# Patient Record
Sex: Female | Born: 1980 | Race: Black or African American | Hispanic: No | Marital: Single | Smoking: Never smoker
Health system: Southern US, Community
[De-identification: ages and names within clinical notes are randomized; demographics above are authoritative.]

## PROBLEM LIST (undated history)

## (undated) DIAGNOSIS — N920 Excessive and frequent menstruation with regular cycle: Secondary | ICD-10-CM

## (undated) HISTORY — DX: Excessive and frequent menstruation with regular cycle: N92.0

## (undated) HISTORY — PX: TONSILLECTOMY: SUR1361

---
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
## 2002-04-11 ENCOUNTER — Other Ambulatory Visit: Admission: RE | Admit: 2002-04-11 | Discharge: 2002-04-11 | Payer: Self-pay | Admitting: Obstetrics and Gynecology

## 2002-09-25 ENCOUNTER — Emergency Department (HOSPITAL_COMMUNITY): Admission: EM | Admit: 2002-09-25 | Discharge: 2002-09-25 | Payer: Self-pay | Admitting: Emergency Medicine

## 2003-04-25 ENCOUNTER — Other Ambulatory Visit: Admission: RE | Admit: 2003-04-25 | Discharge: 2003-04-25 | Payer: Self-pay | Admitting: Obstetrics and Gynecology

## 2004-05-13 ENCOUNTER — Other Ambulatory Visit: Admission: RE | Admit: 2004-05-13 | Discharge: 2004-05-13 | Payer: Self-pay | Admitting: Obstetrics and Gynecology

## 2006-03-02 ENCOUNTER — Inpatient Hospital Stay (HOSPITAL_COMMUNITY): Admission: AD | Admit: 2006-03-02 | Discharge: 2006-03-03 | Payer: Self-pay | Admitting: Obstetrics and Gynecology

## 2006-03-10 ENCOUNTER — Emergency Department (HOSPITAL_COMMUNITY): Admission: EM | Admit: 2006-03-10 | Discharge: 2006-03-10 | Payer: Self-pay | Admitting: Family Medicine

## 2006-03-11 ENCOUNTER — Emergency Department (HOSPITAL_COMMUNITY): Admission: EM | Admit: 2006-03-11 | Discharge: 2006-03-11 | Payer: Self-pay | Admitting: Emergency Medicine

## 2006-04-19 ENCOUNTER — Emergency Department (HOSPITAL_COMMUNITY): Admission: EM | Admit: 2006-04-19 | Discharge: 2006-04-19 | Payer: Self-pay | Admitting: Family Medicine

## 2006-08-11 ENCOUNTER — Emergency Department (HOSPITAL_COMMUNITY): Admission: EM | Admit: 2006-08-11 | Discharge: 2006-08-11 | Payer: Self-pay | Admitting: Emergency Medicine

## 2006-09-29 ENCOUNTER — Other Ambulatory Visit: Admission: RE | Admit: 2006-09-29 | Discharge: 2006-09-29 | Payer: Self-pay | Admitting: Obstetrics and Gynecology

## 2010-01-10 ENCOUNTER — Encounter: Payer: Self-pay | Admitting: Family Medicine

## 2010-01-10 ENCOUNTER — Other Ambulatory Visit: Admission: RE | Admit: 2010-01-10 | Discharge: 2010-01-10 | Payer: Self-pay | Admitting: Family Medicine

## 2010-01-10 LAB — CONVERTED CEMR LAB: Pap Smear: NORMAL

## 2010-02-13 ENCOUNTER — Emergency Department (HOSPITAL_COMMUNITY): Admission: EM | Admit: 2010-02-13 | Discharge: 2010-02-13 | Payer: Self-pay | Admitting: Family Medicine

## 2010-09-29 ENCOUNTER — Ambulatory Visit: Payer: Self-pay | Admitting: Family Medicine

## 2010-09-29 DIAGNOSIS — N920 Excessive and frequent menstruation with regular cycle: Secondary | ICD-10-CM | POA: Insufficient documentation

## 2010-09-29 DIAGNOSIS — N898 Other specified noninflammatory disorders of vagina: Secondary | ICD-10-CM | POA: Insufficient documentation

## 2010-09-29 LAB — CONVERTED CEMR LAB
Cholesterol: 154 mg/dL (ref 0–200)
HDL: 46.7 mg/dL (ref >39.00)
LDL Cholesterol: 90 mg/dL (ref 0–99)
Triglycerides: 86 mg/dL (ref 0.0–149.0)
VLDL: 17.2 mg/dL (ref 0.0–40.0)
Whiff Test: POSITIVE

## 2010-10-22 ENCOUNTER — Ambulatory Visit: Payer: Self-pay | Admitting: Family Medicine

## 2010-10-22 DIAGNOSIS — R300 Dysuria: Secondary | ICD-10-CM | POA: Insufficient documentation

## 2010-10-22 LAB — CONVERTED CEMR LAB
Bilirubin Urine: NEGATIVE
Glucose, Urine, Semiquant: NEGATIVE
Ketones, urine, test strip: NEGATIVE
Protein, U semiquant: NEGATIVE
Specific Gravity, Urine: >=1.03
Urine crystals, microscopic: 0 /hpf
Urobilinogen, UA: 0.2
Whiff Test: NEGATIVE

## 2010-11-10 ENCOUNTER — Ambulatory Visit: Payer: Self-pay | Admitting: Family Medicine

## 2010-11-10 LAB — CONVERTED CEMR LAB
KOH Prep: NEGATIVE
Whiff Test: POSITIVE

## 2010-11-11 LAB — CONVERTED CEMR LAB: Chlamydia, DNA Probe: NEGATIVE

## 2010-11-21 ENCOUNTER — Encounter: Payer: Self-pay | Admitting: Family Medicine

## 2011-01-06 NOTE — Letter (Signed)
Summary: Generic Letter  [facility]  [address] [place], [place] [address]   Phone: [phone]  Fax: [phone]    09/29/2010  Aryel [place] [address] [place], [place]  [address]  Dear Ms. [place],     We have received your lab results and Dr. Dayton Martes says that your cholesterol levels look excellent!!! Keep up the good work.  Enclosed is a copy of your lab results.      Sincerely,       Linde Gillis CMA (AAMA)for Dr. Ruthe Mannan

## 2011-01-06 NOTE — Assessment & Plan Note (Signed)
Summary: ? VAGINAL INFECTION   Vital Signs:  Patient profile:   30 year old female Height:      64.5 inches Weight:      246.04 pounds BMI:     41.73 Temp:     98.5 degrees F oral Pulse rate:   80 / minute Pulse rhythm:   regular BP sitting:   120 / 72  (left arm) Cuff size:   large  Vitals Entered By: Benny Lennert CMA Duncan Dull) (October 22, 2010 3:53 PM)  History of Present Illness: Chief complaint ? vaginal infection  pt seen by Dr Dayton Martes in oct for BV and was tx with two times a day flagyl for one week  had odor and discharge at that time  vomiting almost every day from the flagyl  no pelvic pain   period is about to start   now wonders if she also had a uti - frequent urination and some burning  that started 3 days ago  never had one in the past   some low back pain no fever  no blood in urine   stopped the yaz after week of vomiting  will start back with next menses  not sexually active - so no worries about std  no hx of std      Allergies (verified): No Known Drug Allergies  Past History:  Past Medical History: Last updated: 09/29/2010 Menorrhagia  Past Surgical History: Last updated: 09/29/2010 Tonsillectomy  Family History: Last updated: 09/29/2010 Family History Diabetes 1st degree relative  Social History: Last updated: 09/29/2010 Never Smoked Alcohol use-yes single, 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 Eyes:  Denies discharge and eye irritation. CV:  Denies chest pain or discomfort and lightheadness. Resp:  Denies shortness of breath. GI:  Denies abdominal pain, change in bowel habits, nausea, and vomiting. GU:  Complains of dysuria; denies discharge. Derm:  Complains of itching; denies rash. Heme:  Denies abnormal bruising and bleeding.  Physical Exam  General:  alert and overweight-appearing.     Head:  normocephalic, atraumatic, and no abnormalities observed.   Eyes:  vision grossly intact, pupils equal, pupils round, and pupils reactive to light.   Mouth:  throat is clear  Neck:  No deformities, masses, or tenderness noted. Lungs:  Normal respiratory effort, chest expands symmetrically. Lungs are clear to auscultation, no crackles or wheezes. Heart:  Normal rate and regular rhythm. S1 and S2 normal without gallop, murmur, click, rub or other extra sounds. Abdomen:  no suprapubic tenderness or fullness felt  Genitalia:  white somewhat thick dc noted without odor  normal introitus and no external lesions.   Skin:  Intact without suspicious lesions or rashes Cervical Nodes:  No lymphadenopathy noted Inguinal Nodes:  No significant adenopathy Psych:  normal affect, talkative and pleasant    Impression & Recommendations:  Problem # 1:  VAGINAL DISCHARGE (ICD-623.5) Assessment Deteriorated  with some clue cells - pt priorly could not keep down flagyl so tx with metrogel vaginal  also few hyphae- tx with diflucan update if not imp Her updated medication list for this problem includes:    Metrogel-vaginal 0.75 % Gel (Metronidazole) .Marland Kitchen... 1 applicator intravaginally at bedtime for 10 days  Orders: UA Dipstick W/ Micro (manual) ([address]) Wet Prep (60454UJ)  Problem # 2:  DYSURIA (ICD-788.1) Assessment: New ? assoc with above few wbc in urine sent for cx  update  disc imp of water intake The following medications were removed from the medication list:    Flagyl 500 Mg Tabs (Metronidazole) .Marland Kitchen... 1 tab by mouth two times a day x 7 days  Orders: T-Culture, Urine ([address]) Specimen Handling ([address]) UA Dipstick W/ Micro (manual) ([address]) Wet Prep (91478GN)  Complete Medication List: 1)  Yaz 3-0.02 Mg Tabs (Drospirenone-ethinyl estradiol) .... Use  as directed 2)  Metrogel-vaginal 0.75 % Gel (Metronidazole) .Marland Kitchen.. 1 applicator intravaginally at bedtime for 10 days 3)   Diflucan 150 Mg Tabs (Fluconazole) .... Take one by mouth times one for yeast infection  Patient Instructions: 1)  take the diflucan pill orally for yeast  2)  use the metrogel vaginal for bacterial vaginosis  3)  I am sending urine for a culture -- and will update you when that returns  4)  drink lots of water  Prescriptions: METROGEL-VAGINAL 0.75 % GEL (METRONIDAZOLE) 1 applicator intravaginally at bedtime for 10 days  #1 course x 0   Entered and Authorized by:   Judith Part MD   Signed by:   Judith Part MD on 10/22/2010   Method used:   Print then Give to Patient   RxID:   [id number] DIFLUCAN 150 MG TABS (FLUCONAZOLE) take one by mouth times one for yeast infection  #1 x 0   Entered and Authorized by:   Judith Part MD   Signed by:   Judith Part MD on 10/22/2010   Method used:   Print then Give to Patient   RxID:   [id number] METROGEL-VAGINAL 0.75 % GEL (METRONIDAZOLE) 1 applicator intravaginally at bedtime for 10 days  #1 course x 0   Entered and Authorized by:   Judith Part MD   Signed by:   Judith Part MD on 10/22/2010   Method used:   Telephoned to ...       [facility] (retail)       [address]       [place], [place]    [place]       Ph: [phone]       Fax: [phone]   RxID:   [id number]    Orders Added: 1)  T-Culture, Urine [[id number]] 2)  Specimen Handling [[address]] 3)  Est. Patient Level III [35573] 4)  UA Dipstick W/ Micro (manual) [81[address]] 5)  Wet Prep [22025KY]    Current Allergies (reviewed today): No known allergies   Laboratory Results   Urine Tests  Date/Time Received: October 22, 2010 4:06 PM  Date/Time Reported: October 22, 2010 4:06 PM   Routine Urinalysis   Color: yellow Appearance: Clear Glucose: negative   (Normal Range: Negative) Bilirubin: negative   (Normal Range: Negative) Ketone: negative   (Normal Range: Negative) Spec. Gravity: >=1.030   (Normal Range:  1.003-1.035) Blood: trace-lysed   (Normal Range: Negative) pH: 5.0   (Normal Range: 5.0-8.0) Protein: negative   (Normal Range: Negative) Urobilinogen: 0.2   (Normal Range: 0-1) Nitrite: negative   (Normal Range: Negative) Leukocyte Esterace: trace   (Normal Range: Negative)  Urine Microscopic WBC/HPF: 1-2 RBC/HPF: 0 Bacteria/HPF: few Mucous/HPF: few Epithelial/HPF: 0-1 Crystals/HPF: 0 Casts/LPF: 0 Yeast/HPF: 0 Other: 0      Wet Mount Source: vaginal WBC/hpf: 10-20 Bacteria/hpf: 1+  Rods Clue cells/hpf: few  Negative whiff Yeast/hpf: few Wet Mount KOH: Negative Trichomonas/hpf: none

## 2011-01-06 NOTE — Assessment & Plan Note (Signed)
Summary: VAGINAL INFECTION/CLE   Vital Signs:  Patient profile:   30 year old female Height:      64.5 inches Weight:      243.50 pounds BMI:     41.30 Temp:     98.7 degrees F oral Pulse rate:   72 / minute Pulse rhythm:   regular BP sitting:   120 / 80  (left arm) Cuff size:   large  Vitals Entered By: Linde Gillis CMA Duncan Dull) (November 10, 2010 2:56 PM) CC: vaginal infection   History of Present Illness: Chief complaint ? vaginal infection Treated in October for BV with Flagyl. vomiting almost every day from the Flagyl so admittedly not very compliant. no pelvic pain   period is about to start    no hx of std and not currently sexually active. Saw Dr. Milinda Antis last month, treated with Metrogel, sees no improvement.  Still has odor and some discharge. No itching or pelvic pain.     Current Medications (verified): 1)  Yaz 3-0.02 Mg  Tabs (Drospirenone-Ethinyl Estradiol) .... Use  As Directed 2)  Clindamycin Hcl 300 Mg Caps (Clindamycin Hcl) .Marland Kitchen.. 1 Tab By Mouth Two Times A Day X 7 Days  Allergies (verified): No Known Drug Allergies  Review of Systems      See HPI GU:  Complains of discharge; denies abnormal vaginal bleeding, dysuria, incontinence, urinary frequency, and urinary hesitancy.  Physical Exam  General:  alert and overweight-appearing.   Genitalia:  white somewhat thick dc noted without odor  normal introitus and no external lesions.   Psych:  normal affect, talkative and pleasant    Impression & Recommendations:  Problem # 1:  VAGINAL DISCHARGE (ICD-623.5) Assessment Deteriorated Will try oral clindamycin.  Follow up if no improvement, consider Boric acid. Although pt not sexually active currently, will get GC/Chlamydia probe. The following medications were removed from the medication list:    Metrogel-vaginal 0.75 % Gel (Metronidazole) .Marland Kitchen... 1 applicator intravaginally at bedtime for 10 days  Orders: T-GC Probe, genital  [phone]) T-Chlamydia Probe, genital ([address]-91478) Wet Prep [phone])  Complete Medication List: 1)  Yaz 3-0.02 Mg Tabs (Drospirenone-ethinyl estradiol) .... Use  as directed 2)  Clindamycin Hcl 300 Mg Caps (Clindamycin hcl) .Marland Kitchen.. 1 tab by mouth two times a day x 7 days Prescriptions: CLINDAMYCIN HCL 300 MG CAPS (CLINDAMYCIN HCL) 1 tab by mouth two times a day x 7 days  #14 x 0   Entered and Authorized by:   Ruthe Mannan MD   Signed by:   Ruthe Mannan MD on 11/10/2010   Method used:   Electronically to        [id number] (retail)       [address]       [place], [place]  [address]       Ph: [phone]       Fax: [phone]   RxID:   [id number]    Orders Added: 1)  T-GC Probe, genital [phone] 2)  T-Chlamydia Probe, genital [[id number]] 3)  Wet Prep [16010XN] 4)  Est. Patient Level IV [23557]    Current Allergies (reviewed today): No known allergies   Laboratory Results    Wet Mount/KOH Source: vaginal WBC/hpf 1-5 Bacteria/hpf 2+ Clue cells/hpf few  Positive whiff Yeast/hpf none KOH Negative Trichomonas/hpf none

## 2011-01-06 NOTE — Assessment & Plan Note (Signed)
Summary: NEW PT TO EST/CLE   Vital Signs:  Patient profile:   30 year old female Height:      64.5 inches Weight:      243 pounds BMI:     41.22 Temp:     98.3 degrees F oral Pulse rate:   80 / minute Pulse rhythm:   regular BP sitting:   118 / 74  (left arm) Cuff size:   large  Vitals Entered By: Linde Gillis CMA Duncan Dull) (September 29, 2010 9:56 AM) CC: new patient, establish care   History of Present Illness: 30 yo here to establish care.  Vaginal discharge- thin vaginal d/c for past two months, assocaited with an odor.  No itching or burning.  Has not had sexual intercourse in over 1 1/2 years.  No dysuria or labial or vaginal lesions. No fevers, chills or abdominal pain.  Menorrhagia- periods very irregular.  LMP was 04/17/2010.  Has been irregular since she started menstruating.  When she does have a peiod, it is very heavy and associated with cramping.  Was previously on Yaz which regulated her periods well.  Preventive Screening-Counseling & Management  Alcohol-Tobacco     Smoking Status: never  Current Medications (verified): 1)  Cheratussin Ac 100-10 Mg/65ml Syrp (Guaifenesin-Codeine) .... Take As Directed 2)  Flagyl 500 Mg Tabs (Metronidazole) .Marland Kitchen.. 1 Tab By Mouth Two Times A Day X 7 Days 3)  Yaz 3-0.02 Mg  Tabs (Drospirenone-Ethinyl Estradiol) .... 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 Eyes:  Denies blurring. ENT:  Denies difficulty swallowing. CV:  Denies chest pain or discomfort. Resp:  Denies shortness of breath. GI:  Denies abdominal pain, nausea, and vomiting. GU:  Complains of discharge; denies dysuria, genital sores, hematuria, incontinence, nocturia, urinary frequency, and urinary hesitancy. MS:  Denies joint pain, joint redness, and joint swelling. Derm:  Denies rash. Neuro:  Denies headaches. Psych:  Denies anxiety and depression. Endo:  Denies cold intolerance and heat intolerance. Heme:  Denies abnormal bruising and bleeding.  Physical Exam  General:  alert and overweight-appearing.   Head:  normocephalic, atraumatic, and no abnormalities observed.   Eyes:  vision grossly intact, pupils equal, pupils round, and pupils reactive to light.   Ears:  R ear normal and L ear normal.   Nose:  no external deformity.   Mouth:  good dentition.   Lungs:  Normal respiratory effort, chest expands symmetrically. Lungs are clear to auscultation, no crackles or wheezes. Heart:  Normal rate and regular rhythm. S1 and S2 normal without gallop, murmur, click, rub or other extra sounds. Abdomen:  Bowel sounds positive,abdomen soft and non-tender without masses, organomegaly or hernias noted. Genitalia:  thin vaginal d/c normal introitus, no external lesions, no vaginal or cervical lesions, no vaginal atrophy, and no friaility or hemorrhage.   Msk:  normal ROM.   Extremities:  no edema Neurologic:  alert & oriented X3 and DTRs symmetrical and normal.   Skin:  Intact without suspicious lesions or rashes Psych:  Cognition and judgment appear intact. Alert and cooperative with normal attention span and concentration. No apparent delusions, illusions, hallucinations   Impression & Recommendations:  Problem # 1:  VAGINAL DISCHARGE (ICD-623.5) Assessment New Wet prep consistent with BV. Will treat with Flagyl x 7 days. Orders: Wet Prep ([address]UE)  Problem #  2:  MENORRHAGIA (ICD-626.2) Assessment: Deteriorated Will restart Yaz to regulate periods. ?PCOS- check lipids and glucose today. Her updated medication list for this problem includes:    Yaz 3-0.02 Mg Tabs (Drospirenone-ethinyl estradiol) ..... Use  as directed  Complete Medication List: 1)  Cheratussin Ac 100-10 Mg/81ml Syrp (Guaifenesin-codeine) .... Take as directed 2)  Flagyl 500 Mg Tabs (Metronidazole) .Marland Kitchen.. 1 tab by mouth two times a day x 7 days 3)  Yaz 3-0.02 Mg Tabs (Drospirenone-ethinyl estradiol) .... Use  as directed  Other Orders: Venipuncture (45409) TLB-Lipid Panel (80061-LIPID) Prescriptions: YAZ 3-0.02 MG  TABS (DROSPIRENONE-ETHINYL ESTRADIOL) Use  as directed  #1 x 11   Entered and Authorized by:   Ruthe Mannan MD   Signed by:   Ruthe Mannan MD on 09/29/2010   Method used:   Faxed to ...       [facility] (retail)       [address]       [place], [place]    [place]       Ph: [phone]       Fax: [phone]   RxID:   [id number] FLAGYL 500 MG TABS (METRONIDAZOLE) 1 tab by mouth two times a day x 7 days  #14 x 0   Entered and Authorized by:   Ruthe Mannan MD   Signed by:   Ruthe Mannan MD on 09/29/2010   Method used:   Faxed to ...       [facility] (retail)       [address]       [place], [place]    [place]       Ph: [phone]       Fax: [phone]   RxID:   [id number]    Orders Added: 1)  Wet Prep [41660YT] 2)  Venipuncture [01601] 3)  TLB-Lipid Panel [80061-LIPID] 4)  New Patient Level III [09323]    Current Allergies (reviewed today): No known allergies  Laboratory Results    Wet Mount/KOH Source: vaginal Bacteria/hpf rare Clue cells/hpf few  Positive whiff Yeast/hpf none Trichomonas/hpf none   Flu Vaccine Result Date:  09/22/2010 Flu Vaccine Result:  historical TD Result Date:  08/02/2002 TD Result:  historical PAP Result Date:  11/21/2009 PAP Result:  historical-normal    Past Medical  History:    Menorrhagia  Past Surgical History:    Tonsillectomy

## 2011-01-08 NOTE — Miscellaneous (Signed)
Summary: prevention update  Clinical Lists Changes  Observations: Added new observation of PAP DUE: 01/11/2012 (11/21/2010 13:41) Added new observation of HDLNXTDUE: 09/30/2015 (11/21/2010 13:41) Added new observation of LDLNXTDUE: 09/30/2015 (11/21/2010 13:41) Added new observation of LAST PAP DAT: 01/10/2010 (01/10/2010 13:42) Added new observation of PAP SMEAR: normal (01/10/2010 13:42)     Last PAP:  historical-normal (11/21/2009 10:33:02 AM) PAP Result Date:  01/10/2010 PAP Result:  normal PAP Next Due:  2 yr

## 2011-01-08 NOTE — Letter (Signed)
Summary: [facility] [place]   Imported By: Lanelle Bal 11/28/2010 11:48:13  _____________________________________________________________________  External Attachment:    Type:   Image     Comment:   External Document

## 2011-02-26 ENCOUNTER — Other Ambulatory Visit (INDEPENDENT_AMBULATORY_CARE_PROVIDER_SITE_OTHER): Payer: 59 | Admitting: Family Medicine

## 2011-02-26 ENCOUNTER — Encounter: Payer: Self-pay | Admitting: Family Medicine

## 2011-02-26 DIAGNOSIS — Z0000 Encounter for general adult medical examination without abnormal findings: Secondary | ICD-10-CM

## 2011-02-26 DIAGNOSIS — Z136 Encounter for screening for cardiovascular disorders: Secondary | ICD-10-CM

## 2011-02-26 LAB — BASIC METABOLIC PANEL
CO2: 25 mEq/L (ref 19–32)
Calcium: 9 mg/dL (ref 8.4–10.5)
Chloride: 102 mEq/L (ref 96–112)
Glucose, Bld: 88 mg/dL (ref 70–99)
Potassium: 4.3 mEq/L (ref 3.5–5.1)

## 2011-02-26 LAB — LIPID PANEL
Cholesterol: 163 mg/dL (ref 0–200)
Total CHOL/HDL Ratio: 3
Triglycerides: 102 mg/dL (ref 0.0–149.0)
VLDL: 20.4 mg/dL (ref 0.0–40.0)

## 2011-02-26 LAB — HM PAP SMEAR

## 2011-03-02 ENCOUNTER — Ambulatory Visit (INDEPENDENT_AMBULATORY_CARE_PROVIDER_SITE_OTHER): Payer: 59 | Admitting: Family Medicine

## 2011-03-02 ENCOUNTER — Encounter: Payer: Self-pay | Admitting: Family Medicine

## 2011-03-02 DIAGNOSIS — N920 Excessive and frequent menstruation with regular cycle: Secondary | ICD-10-CM

## 2011-03-02 DIAGNOSIS — N898 Other specified noninflammatory disorders of vagina: Secondary | ICD-10-CM

## 2011-03-02 DIAGNOSIS — Z0000 Encounter for general adult medical examination without abnormal findings: Secondary | ICD-10-CM

## 2011-03-02 DIAGNOSIS — G4700 Insomnia, unspecified: Secondary | ICD-10-CM | POA: Insufficient documentation

## 2011-03-02 MED ORDER — NORGESTIM-ETH ESTRAD TRIPHASIC 0.18/0.215/0.25 MG-35 MCG PO TABS: 1.0000 | ORAL_TABLET | ORAL | Status: DC

## 2011-03-02 MED ORDER — TRAZODONE HCL 50 MG PO TABS: 50.0000 mg | ORAL_TABLET | Freq: Every day | ORAL | Status: DC

## 2011-03-02 NOTE — Assessment & Plan Note (Signed)
Improved but noticing more irritability and insomnia. Will d/c Yaz, try trisprintec.

## 2011-03-02 NOTE — Patient Instructions (Signed)
IMPORTANT: HOW TO USE THIS INFORMATION:  This is a summary and does NOT have all possible information about this product. This information does not assure that this product is safe, effective, or appropriate for you. This information is not individual medical advice and does not substitute for the advice of your health care professional. Always ask your health care professional for complete information about this product and your specific health needs.    TRAZODONE - ORAL (TRAZZ-oh-doan)    COMMON BRAND NAME(S): Desyrel    WARNING:  Antidepressant medications are used to treat a variety of conditions, including depression and other mental/mood disorders. These medications can help prevent suicidal thoughts/attempts and provide other important benefits. However, studies have shown that a small number of people (especially people younger than 69) who take antidepressants for any condition may experience worsening depression, other mental/mood symptoms, or suicidal thoughts/attempts. Therefore, it is very important to talk with the doctor about the risks and benefits of antidepressant medication (especially for people younger than 25), even if treatment is not for a mental/mood condition. Tell the doctor immediately if you notice worsening depression/other psychiatric conditions, unusual behavior changes (including possible suicidal thoughts/attempts), or other mental/mood changes (including new/worsening anxiety, panic attacks, trouble sleeping, irritability, hostile/angry feelings, impulsive actions, severe restlessness, very rapid speech). Be especially watchful for these symptoms when a new antidepressant is started or when the dose is changed.    USES:  This medication is used to treat depression. Trazodone works by helping to restore the balance of a certain natural chemical (serotonin) in the brain.    OTHER USES:  This section contains uses of this drug that are not listed in the approved professional  labeling for the drug but that may be prescribed by your health care professional. Use this drug for a condition that is listed in this section only if it has been so prescribed by your health care professional. This drug is used to help people with trouble sleeping (insomnia) to fall asleep. It is also used to help people with anxiety to relax.    HOW TO USE:  Read the Medication Guide provided by your pharmacist before you start using trazodone and each time you get a refill. If you have any questions, consult your doctor or pharmacist. Take this medication by mouth, usually once or twice daily after a meal or snack or as directed by your doctor. If drowsiness is a problem and you are taking 1 dose daily, take it at bedtime. If you are taking 2 doses each day, it may help to take 1 of the doses at bedtime. Follow your doctor's directions carefully. Dosage is based on your medical condition and response to treatment. To reduce your risk of side effects, your doctor may start you at a low dose and gradually increase your dose. Take this medication exactly as prescribed. Do not increase your dose or take this medication more often than prescribed. Your condition will not improve any faster, and the risk of serious side effects may be increased. It is important to continue taking this medication as prescribed even if you feel well. To help you remember, take it at the same time(s) each day. Do not stop taking this medication without consulting your doctor. Nausea, headache, or tiredness can occur if the drug is suddenly stopped. It may take 2 to 4 weeks before you notice the full effects of this medication. Tell your doctor if your condition persists or worsens.    SIDE EFFECTS:  See also the Warning section. Nausea, vomiting, diarrhea, drowsiness, dizziness, tiredness, blurred vision, changes in weight, headache, muscle ache/pain, dry mouth, bad taste in the mouth, stuffy nose, constipation, or change in sexual  interest/ability may occur. If any of these effects persist or worsen, tell your doctor or pharmacist promptly. To relieve dry mouth, suck on (sugarless) hard candy or ice chips, chew (sugarless) gum, drink water, or use a saliva substitute. Remember that your doctor has prescribed this medication because he or she has judged that the benefit to you is greater than the risk of side effects. Many people using this medication do not have serious side effects. Tell your doctor immediately if any of these unlikely but serious side effects occur: shaking (tremors), nightmares, ringing in the ears, problems urinating, blood in urine. Tell your doctor immediately if any of these rare but very serious side effects occur: signs of infection (e.g., fever, persistent sore throat), shortness of breath, stomach/abdominal pain. Get medical help right away if any of these rare but serious side effects occur: chest/jaw/left arm pain, fainting, fast/irregular heartbeat, seizures, severe dizziness. For males, in the very unlikely event you have a painful or prolonged erection (priapism) lasting 4 or more hours, stop using this drug and seek immediate medical attention, or permanent problems could occur. A very serious allergic reaction to this drug is rare. However, seek immediate medical attention if you notice any symptoms of a serious allergic reaction, including: rash, itching/swelling (especially of the face/tongue/throat), severe dizziness, trouble breathing. This is not a complete list of possible side effects. If you notice other effects not listed above, contact your doctor or pharmacist. In the [place] - Call your doctor for medical advice about side effects. You may report side effects to FDA at 1-800-FDA-1088. In [place] - Call your doctor for medical advice about side effects. You may report side effects to [facility] [place] at [phone].    PRECAUTIONS:  Before taking trazodone, tell your doctor or pharmacist if you are  allergic to it; or to nefazodone; or if you have any other allergies. This product may contain inactive ingredients, which can cause allergic reactions or other problems. Talk to your pharmacist for more details. This medication should not be used if you have certain medical conditions. Before using this medicine, consult your doctor or pharmacist if you have: history of priapism from taking trazodone, recent heart attack. Before using this medication, tell your doctor or pharmacist your medical history, especially of: personal or family history of bipolar disorder, personal or family history of suicide attempts, heart disease (e.g., irregular heartbeat), liver disease, kidney disease, blood pressure problems. This drug may make you dizzy or drowsy or cause blurred vision. Do not drive, use machinery, or do any activity that requires alertness or clear vision until you are sure you can perform such activities safely. Limit alcoholic beverages. To reduce the risk of dizziness and lightheadedness, get up slowly when rising from a sitting or lying position. Trazodone may cause a condition that affects the heart rhythm (QT prolongation). QT prolongation can infrequently result in serious (rarely fatal) fast/irregular heartbeat and other symptoms (such as severe dizziness, fainting) that need medical attention right away. The risk of QT prolongation may be increased if you have certain medical conditions or are taking other drugs that may affect the heart rhythm. Before using trazodone, tell your doctor or pharmacist of all the drugs you take and if you have any of the following conditions: certain heart problems (heart failure, slow  heartbeat, QT prolongation in the EKG), family history of certain heart problems (QT prolongation in the EKG, sudden cardiac death). Low levels of potassium or magnesium in the blood may also increase your risk of QT prolongation. This risk may increase if you use certain drugs (such as  diuretics/"water pills") or if you have conditions such as severe sweating, diarrhea, or vomiting. Talk to your doctor about using trazodone safely. Caution is advised when using this drug in the elderly because they may be more sensitive to the effects of the drug, especially drowsiness and dizziness. During pregnancy, this medication should be used only when clearly needed. Discuss the risks and benefits with your doctor. This medication passes into breast milk. Consult your doctor before breast-feeding.    DRUG INTERACTIONS:  Your doctor or pharmacist may already be aware of any possible drug interactions and may be monitoring you for them. Do not start, stop, or change the dosage of any medicine before checking with your doctor or pharmacist first. This drug should not be used with the following medications because very serious interactions may occur: sibutramine, triazolam. If you are currently using any of the medications listed above, tell your doctor or pharmacist before starting trazodone. Before using this medication, tell your doctor or pharmacist of all prescription and nonprescription/herbal products you may use, especially of: other antidepressants (e.g., amitriptyline, bupropion, fluoxetine, nefazodone, venlafaxine), digoxin, drugs affecting liver enzymes that remove trazodone from your body (such as azole antifungals including ketoconazole/itraconazole, HIV protease inhibitor drugs including atazanavir/indinavir/ritonavir, macrolide antibiotics including erythromycin, cimetidine, rifamycins including rifampin, St. John's wort, certain anti-seizure medications including carbamazepine), drugs for high blood pressure, ginkgo, MAO inhibitors (e.g., furazolidone, isocarboxazid, linezolid, moclobemide, phenelzine, procarbazine, rasagiline, selegiline, tranylcypromine). Tell your doctor or pharmacist if you also take drugs that cause drowsiness such as: certain antihistamines (e.g., diphenhydramine),  anti-seizure drugs (e.g., phenytoin), medicine for sleep or anxiety (e.g., alprazolam, diazepam, zolpidem), muscle relaxants, narcotic pain relievers (e.g., codeine), psychiatric medicines (e.g., chlorpromazine, risperidone). Check the labels on all your medicines (e.g., cough-and-cold products) because they may contain ingredients that may cause drowsiness. Ask your pharmacist about using those products safely. Before you have surgery with a general anesthetic, including dental surgery, tell the doctor or dentist you are taking trazodone. This document does not contain all possible interactions. Therefore, before using this product, tell your doctor or pharmacist of all the products you use. Keep a list of all your medications with you, and share the list with your doctor and pharmacist.    OVERDOSE:  If overdose is suspected, contact your local poison control center or emergency room immediately. [place] residents can call the [place] National Poison Hotline at (203) 689-9265. [place] residents can call a Technical sales engineer center. Symptoms of overdose may include: painful/prolonged erection, slow/rapid/irregular heartbeat, unusual drowsiness, unusual dizziness, vomiting, trouble breathing, seizures.    NOTES:  Do not share this medication with others. Psychiatric and/or medical checkups (and laboratory tests) should be done periodically to monitor your progress and check for side effects. Consult your doctor for more details. Have your blood pressure and pulse checked regularly while taking this medication. Discuss with your doctor how to monitor your own blood pressure and pulse.    MISSED DOSE:  If you miss a dose, take it as soon as you remember. If it is near the time of the next dose, skip the missed dose and resume your usual dosing schedule. Do not double the dose to catch up.    STORAGE:  Store at  room temperature between 59-86 degrees F (15-30 degrees C) away from light and moisture. Do not store  above 104 degrees F (40 degrees C). Do not store in the bathroom. Keep all medicines away from children and pets. Do not flush medications down the toilet or pour them into a drain unless instructed to do so. Properly discard this product when it is expired or no longer needed. Consult your pharmacist or local waste disposal company for more details about how to safely discard your product.    MEDICAL ALERT:  Your condition can cause complications in a medical emergency. For information about enrolling in [place], call [phone] ([place]) or [phone] ([place]).    Information last revised July 2010 Copyright(c) 2010 [employer].

## 2011-03-02 NOTE — Progress Notes (Signed)
Subjective:    Patient ID: Brenda Ewing, female    DOB: 10-15-1981, 30 y.o.   MRN: [id number]  HPI  30 yo here for CPX.  Not due for pap smear.  Vaginal odor- feels like she has had more of a an order lately. No discharge, itching, dysuria or discomfort during intercourse. Sexually active with one partner.  Using Yaz for OCP which has helped with her menorrhagia but she feels more depressed and irritable on Yaz. Feels like she is more easily agitated and tearful.  No SI or HI.  She is having more trouble sleeping and staying asleep at night. Not having any spotting.   Has been going to the gym three times per week, feels good. Losing 1-2 pounds per month.    Review of Systems  Constitutional: Negative for appetite change.  HENT: Negative for congestion.   Respiratory: Negative for shortness of breath.   Cardiovascular: Negative for chest pain.  Genitourinary: Negative for dysuria, vaginal bleeding, genital sores, vaginal pain, menstrual problem and pelvic pain.  Neurological: Negative for dizziness.  Hematological: Negative for adenopathy.  Psychiatric/Behavioral: Positive for sleep disturbance. Negative for suicidal ideas and hallucinations.     Past Medical History  Diagnosis Date  . Menorrhagia     Past Surgical History  Procedure Date  . Tonsillectomy     History   Social History  . Marital Status: Single   Occupational History  . 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 Smoking status: Never Smoker     Family History  Problem Relation Age of Onset  . Diabetes Other     Allergies not on file  Current Outpatient Prescriptions on File Prior to Visit  Medication Sig Dispense Refill  . clindamycin (CLEOCIN) 300 MG capsule Take one tablet by mouth two times daily for seven days       . drospirenone-ethinyl estradiol (YAZ) 3-0.02 MG per tablet Take 1 tablet by mouth daily.                Objective:   Physical Exam  BP 120/84   Pulse 71  Temp(Src) 98.7 F (37.1 C) (Oral)  Ht 5' 4.5" (1.638 m)  Wt 244 lb (110.678 kg)  BMI 41.24 kg/m2  General:  Well-developed,well-nourished,in no acute distress; alert,appropriate and cooperative throughout examination Head:  normocephalic and atraumatic.   Eyes:  vision grossly intact, pupils equal, pupils round, and pupils reactive to light.   Ears:  R ear normal and L ear normal.   Nose:  no external deformity.   Mouth:  good dentition.   Neck:  No deformities, masses, or tenderness noted. Breasts:  No mass, nodules, thickening, tenderness, bulging, retraction, inflamation, nipple discharge or skin changes noted.   Lungs:  Normal respiratory effort, chest expands symmetrically. Lungs are clear to auscultation, no crackles or wheezes. Heart:  Normal rate and regular rhythm. S1 and S2 normal without gallop, murmur, click, rub or other extra sounds. Abdomen:  Bowel sounds positive,abdomen soft and non-tender without masses, organomegaly or hernias noted. Rectal:  no external abnormalities.   Genitalia:  Pelvic Exam:        External: normal female genitalia without lesions or masses        Vagina: normal without lesions or masses        Cervix: normal without lesions or masses        Adnexa: normal bimanual exam without masses or fullness        Uterus:  normal by palpation       Msk:  No deformity or scoliosis noted of thoracic or lumbar spine.   Extremities:  No clubbing, cyanosis, edema, or deformity noted with normal full range of motion of all joints.   Neurologic:  alert & oriented X3 and gait normal.   Skin:  Intact without suspicious lesions or rashes Cervical Nodes:  No lymphadenopathy noted Axillary Nodes:  No palpable lymphadenopathy Psych:  Cognition and judgment appear intact. Alert and cooperative with normal attention span and concentration. No apparent delusions, illusions, hallucinations     Assessment & Plan:

## 2011-03-02 NOTE — Assessment & Plan Note (Signed)
New.  Wet prep negative.  Reassurance provided.  Advised to follow up if she develops new or worsening symptoms.

## 2011-03-02 NOTE — Assessment & Plan Note (Signed)
Reviewed preventive care protocols, scheduled due services, and updated immunizations Discussed nutrition, exercise, diet, and healthy lifestyle.  FLP, BMET within normal limits, discussed with pt.  She was given a copy to take home.

## 2011-03-02 NOTE — Assessment & Plan Note (Signed)
New.  May be related to Yaz but less likely. Has more stessors at work. Will add Trazodone 50 mg nightly.  Follow up in one month.

## 2011-06-17 ENCOUNTER — Ambulatory Visit (INDEPENDENT_AMBULATORY_CARE_PROVIDER_SITE_OTHER): Payer: 59 | Admitting: Family Medicine

## 2011-06-17 ENCOUNTER — Encounter: Payer: Self-pay | Admitting: Family Medicine

## 2011-06-17 VITALS — BP 108/80 | HR 80 | Temp 98.9°F | Wt 246.5 lb

## 2011-06-17 DIAGNOSIS — M62838 Other muscle spasm: Secondary | ICD-10-CM | POA: Insufficient documentation

## 2011-06-17 MED ORDER — CYCLOBENZAPRINE HCL 5 MG PO TABS: 5.0000 mg | ORAL_TABLET | Freq: Three times a day (TID) | ORAL | Status: DC | PRN

## 2011-06-17 NOTE — Patient Instructions (Signed)
Muscle Strain / Pulled Muscle  A pulled muscle, or muscle strain, occurs when a muscle is over-stretched. A small number of muscle fibers may also be torn. This is especially common in athletes. This happens when a sudden violent force placed on a muscle pushes it past its capacity. Usually, recovery from a pulled muscle takes 1 to 2 weeks. But complete healing will take 5 to 6 weeks. There are millions of muscle fibers. Following injury, your body will usually return to normal quickly.  HOME CARE INSTRUCTIONS   While awake, apply ice to the sore muscle for 10 minutes each hour for the first 2 days. Put ice in a plastic bag and place a towel between the bag of ice and your skin.    Do not use the pulled muscle for several days. Do not use the muscle if you have pain.    You may wrap the injured area with an elastic bandage for comfort. Be careful not to bind it too tightly. This may interfere with blood circulation.    Only take over-the-counter or prescription medicines for pain, discomfort, or fever as directed by your caregiver. Do not use aspirin as this will increase bleeding (bruising) at injury site.    Warming up before exercise helps prevent muscle strains.   SEEK MEDICAL CARE IF:  There is increased pain or swelling in the affected area.  MAKE SURE YOU:    Understand these instructions.    Will watch your condition.    Will get help right away if you are not doing well or get worse.   Document Released: 11/23/2005 Document Re-Released: 02/17/2010  [employer] Patient Information 2011 [employer].

## 2011-06-17 NOTE — Progress Notes (Signed)
  Subjective:    Patient ID: Brenda Ewing [place], female    DOB: [birth year], 30 y.o.   MRN: [id number]  HPI  Neck Pain Patient complains of neck pain. Event that precipitated these symptoms: weight training with her personal trainer the night before onset of symptoms.. Onset of symptoms 2 week ago, and have been unchanged since that time. Current symptoms are left sided neck stiffness, hurts to turn head. Patient denies numbness in upper extremities, paresthesias in upper extremities or fingers and weakness in in neck, arms, or hands. Patient has had no prior neck problems. Previous treatments include: none.  Review of Systems  See HPI    Objective:   Physical Exam BP 108/80  Pulse 80  Temp(Src) 98.9 F (37.2 Ewing) (Oral)  Wt 246 lb 8 oz (111.812 kg)  LMP 06/07/2011 Gen:  Alert, very pleasant, NAD MSK:  Pos ttp over left trapezius muscle, passive turning head to left illicits pain, neg spurling Neuro:  Normal strength and sensation in UE bilaterally, normal reflexes.         Assessment & Plan:   1. Trapezius muscle spasm    New. Supportive care discussed, rx for flexeril as needed sent in. See pt instructions for details.

## 2011-09-01 ENCOUNTER — Ambulatory Visit (INDEPENDENT_AMBULATORY_CARE_PROVIDER_SITE_OTHER): Payer: 59 | Admitting: Family Medicine

## 2011-09-01 ENCOUNTER — Encounter: Payer: Self-pay | Admitting: Family Medicine

## 2011-09-01 VITALS — BP 110/86 | HR 82 | Temp 98.7°F | Wt 247.8 lb

## 2011-09-01 DIAGNOSIS — J069 Acute upper respiratory infection, unspecified: Secondary | ICD-10-CM | POA: Insufficient documentation

## 2011-09-01 MED ORDER — CHLORPHENIRAMINE-HYDROCODONE 8-10 MG/5ML PO LQCR: 5.0000 mL | Freq: Two times a day (BID) | ORAL | Status: DC | PRN

## 2011-09-01 NOTE — Patient Instructions (Signed)
.    Drink lots of fluids.  Treat sympotmatically with Mucinex, nasal saline irrigation, and Tylenol/Ibuprofen. Also try claritin D or zyrtec D over the counter- two times a day as needed ( have to sign for them at pharmacy). You can use warm compresses.  Cough suppressant at night. Call if not improving as expected in 5-7 days.

## 2011-09-01 NOTE — Progress Notes (Signed)
SUBJECTIVE:  Brenda Ewing is a 30 y.o. female who complains of dry cough, coryza, congestion, sneezing and sore throat for 7 days. She denies a history of anorexia and chest pain and denies a history of asthma. Patient denies smoke cigarettes.    Patient Active Problem List  Diagnoses  . VAGINAL DISCHARGE  . MENORRHAGIA  . DYSURIA  . Vaginal odor  . Routine general medical examination at a health care facility  . Insomnia  . Trapezius muscle spasm   Past Medical History  Diagnosis Date  . Menorrhagia    Past Surgical History  Procedure Date  . Tonsillectomy    History  Substance Use Topics  . Smoking status: Never Smoker   . Smokeless tobacco: Not on file  . Alcohol Use:    Family History  Problem Relation Age of Onset  . Diabetes Other    No Known Allergies Current Outpatient Prescriptions on File Prior to Visit  Medication Sig Dispense Refill  . cyclobenzaprine (FLEXERIL) 5 MG tablet Take 1 tablet (5 mg total) by mouth every 8 (eight) hours as needed for muscle spasms.  30 tablet  1  . Norgestim-Eth Estrad Triphasic 0.18/0.215/0.25 MG-35 MCG TABS Take 1 tablet by mouth 1 dose over 46 hours.  28 tablet  6   The PMH, PSH, Social History, Family History, Medications, and allergies have been reviewed in [facility], and have been updated if relevant.  OBJECTIVE: BP 110/86  Pulse 82  Temp(Src) 98.7 F (37.1 C) (Oral)  Wt 247 lb 12 oz (112.379 kg)  LMP 08/19/2011  She appears well, vital signs are as noted. Ears normal.  Throat and pharynx normal.  Neck supple. No adenopathy in the neck. Nose is congested. Sinuses non tender. The chest is clear, without wheezes or rales.  ASSESSMENT:  viral upper respiratory illness  PLAN: Symptomatic therapy suggested: push fluids, rest and return office visit prn if symptoms persist or worsen. Lack of antibiotic effectiveness discussed with her. Call or return to clinic prn if these symptoms worsen or fail to improve as  anticipated.

## 2011-11-11 ENCOUNTER — Ambulatory Visit (INDEPENDENT_AMBULATORY_CARE_PROVIDER_SITE_OTHER): Payer: 59 | Admitting: Family Medicine

## 2011-11-11 ENCOUNTER — Encounter: Payer: Self-pay | Admitting: Family Medicine

## 2011-11-11 ENCOUNTER — Ambulatory Visit (INDEPENDENT_AMBULATORY_CARE_PROVIDER_SITE_OTHER)
Admission: RE | Admit: 2011-11-11 | Discharge: 2011-11-11 | Disposition: A | Discharge status: Home / Self Care | Payer: 59 | Source: Ambulatory Visit | Attending: Family Medicine | Admitting: Family Medicine

## 2011-11-11 VITALS — BP 110/82 | HR 63 | Temp 98.2°F | Ht 64.5 in | Wt 245.2 lb

## 2011-11-11 DIAGNOSIS — G5790 Unspecified mononeuropathy of unspecified lower limb: Secondary | ICD-10-CM

## 2011-11-11 NOTE — Progress Notes (Signed)
  Subjective:    Patient ID: Brenda Ewing, female    DOB: [birth year], 30 y.o.   MRN: [id number]  HPI  30 yo here for numbness and tingling from her right knee down to tips of her toes. Numbness includes all of her toes and bottom of her foot. Sleeps on the right side of her body.  Has tried to sleep on her back or on her left side but always wakes up on the her right side.  No known injury to her knee.  Has been exercising more. No swelling of knee. No pain or weakness of knee.  Patient Active Problem List  Diagnoses  . VAGINAL DISCHARGE  . MENORRHAGIA  . DYSURIA  . Vaginal odor  . Routine general medical examination at a health care facility  . Insomnia  . Trapezius muscle spasm  . URI (upper respiratory infection)  . Neuropathy, leg   Past Medical History  Diagnosis Date  . Menorrhagia    Past Surgical History  Procedure Date  . Tonsillectomy    History  Substance Use Topics  . Smoking status: Never Smoker   . Smokeless tobacco: Not on file  . Alcohol Use:    Family History  Problem Relation Age of Onset  . Diabetes Other    No Known Allergies Current Outpatient Prescriptions on File Prior to Visit  Medication Sig Dispense Refill  . Norgestim-Eth Estrad Triphasic 0.18/0.215/0.25 MG-35 MCG TABS Take 1 tablet by mouth 1 dose over 46 hours.  28 tablet  6   The PMH, PSH, Social History, Family History, Medications, and allergies have been reviewed in [facility], and have been updated if relevant.   Review of Systems  See HPI    Objective:   Physical Exam BP 110/82  Pulse 63  Temp(Src) 98.2 F (36.8 C) (Oral)  Ht 5' 4.5" (1.638 m)  Wt 245 lb 4 oz (111.245 kg)  BMI 41.45 kg/m2 Gen:  Alert, very pleasant, NAD MSK:  Right knee normal to inspection and palpation, FROM Neuro:  ?mildly diminished patellar reflex, right,  Good pulses, per pt, decreased sensation from lateral knee down to tips of all toes and bottom of foot. Normal gait- no drop foot.     Assessment & Plan:   1. Neuropathy, leg  MR Tibia Fibula Right Wo Contrast   New. ? Peroneal nerve syndrome? From sleeping on right side. Will get MRI of lower leg to evaluate further. Consider PT, neuro referral/EMG if MRI normal. The patient indicates understanding of these issues and agrees with the plan.

## 2011-11-11 NOTE — Progress Notes (Signed)
Addended by: Dianne Dun on: 11/11/2011 09:18 AM   Modules accepted: Orders

## 2011-11-11 NOTE — Patient Instructions (Signed)
Good to see you. Please stop by to see Brenda Ewing on your way out. 

## 2011-11-12 ENCOUNTER — Other Ambulatory Visit: Payer: Self-pay | Admitting: Family Medicine

## 2011-11-12 DIAGNOSIS — G5790 Unspecified mononeuropathy of unspecified lower limb: Secondary | ICD-10-CM

## 2011-11-13 ENCOUNTER — Other Ambulatory Visit: Payer: 59

## 2011-12-15 ENCOUNTER — Ambulatory Visit (INDEPENDENT_AMBULATORY_CARE_PROVIDER_SITE_OTHER): Payer: 59

## 2011-12-15 DIAGNOSIS — M722 Plantar fascial fibromatosis: Secondary | ICD-10-CM

## 2011-12-30 ENCOUNTER — Ambulatory Visit (INDEPENDENT_AMBULATORY_CARE_PROVIDER_SITE_OTHER): Payer: 59 | Admitting: Family Medicine

## 2011-12-30 ENCOUNTER — Telehealth: Payer: Self-pay | Admitting: Family Medicine

## 2011-12-30 ENCOUNTER — Encounter: Payer: Self-pay | Admitting: Family Medicine

## 2011-12-30 VITALS — BP 120/82 | HR 84 | Temp 98.6°F | Ht 64.5 in | Wt 246.8 lb

## 2011-12-30 DIAGNOSIS — J069 Acute upper respiratory infection, unspecified: Secondary | ICD-10-CM

## 2011-12-30 MED ORDER — HYDROCOD POLST-CHLORPHEN POLST 10-8 MG/5ML PO LQCR: 5.0000 mL | Freq: Two times a day (BID) | ORAL | Status: DC | PRN

## 2011-12-30 NOTE — Telephone Encounter (Signed)
Triage Record Num: [id number] Operator: Chevis Pretty Patient Name: Brenda Ewing Call Date & Time: 12/30/2011 8:55:35AM Patient Phone: [phone] PCP: Ruthe Mannan Patient Gender: Female PCP Fax : [phone] Patient DOB: 12/11/1980 Practice Name: [facility] Reason for Call: Caller: Brenda Ewing/Patient; PCP: Ruthe Mannan Nestor Ramp); CB#: [phone]; ; ; Call regarding Cough/Congestion; states seen in office for cough in the past and had received an Rx for cherritussin. States developed cough symptoms 10 days ago, but OTC products are not helpful for the cough. Would like Rx called in for Cherritussin syrup. Advised cannot call in controlled medications. Cough is disruptive to sleep and she is not sleeping well. Afebrile. Per protocol, emergent symptoms denied; advised appt within 72 hours. Appt sched in office 12/30/11 1415 with Dr. Milinda Antis. Protocol(s) Used: Cough - Adult Recommended Outcome per Protocol: See Provider within 72 Hours Reason for Outcome: Had viral illness with a cough within last month that has not responded to home care Care Advice: ~ Use a cool mist humidifier to moisten air. Be sure to clean according to manufacturer's instructions. Suck on hard candy: Life Savers, lemon drops, etc. (sugar free if diabetic) or herbal throat lozenges may provide relief. ~ Limit or avoid exposure to irritants and allergens (e.g. air pollution, smoke/smoking, chemicals, dust, pollen, pet dander, etc.) ~ ~ Avoid any activity that produces symptoms until evaluated by provider. Increase fluids to 8-12 eight oz (1.6 to 2.4 liters) glasses per day, half of them to be water. Soups, popsicles, fruit juices, non-caffeinated sodas (unless restricting sodium intake), jello, broths, decaf teas, etc. are all okay. Warm fluids can be soothing. ~ ~ Warm fluids may help, or try a mixture of honey and lemon juice in warm tea. ~ SYMPTOM / CONDITION MANAGEMENT ~ CAUTIONS ~ Call provider  if having shortness of breath, coughing up blood-tinged mucus, or a more productive cough. 12/30/2011 9:03:50AM Page 1 of 1 [id number]

## 2011-12-30 NOTE — Assessment & Plan Note (Signed)
With congestion head and chest and bad cough Reassuring exam Px tussionex for cough to use with caution Fluids- rest Disc symptomatic care - see instructions on AVS  Handout given Update if not starting to improve in a week or if worsening

## 2011-12-30 NOTE — Patient Instructions (Signed)
You have a uri with cough - head and chest cold Drink lots of fluids- try to get rest Use cough med with caution- is sedating - try it at night Update if not starting to improve in a week or if worsening  - esp if any fever or shortness of breath  Upper Respiratory Infection, Adult An upper respiratory infection (URI) is also sometimes known as the common cold. The upper respiratory tract includes the nose, sinuses, throat, trachea, and bronchi. Bronchi are the airways leading to the lungs. Most people improve within 1 week, but symptoms can last up to 2 weeks. A residual cough may last even longer.   CAUSES Many different viruses can infect the tissues lining the upper respiratory tract. The tissues become irritated and inflamed and often become very moist. Mucus production is also common. A cold is contagious. You can easily spread the virus to others by oral contact. This includes kissing, sharing a glass, coughing, or sneezing. Touching your mouth or nose and then touching a surface, which is then touched by another person, can also spread the virus. SYMPTOMS   Symptoms typically develop 1 to 3 days after you come in contact with a cold virus. Symptoms vary from person to person. They may include:  Runny nose.     Sneezing.    Nasal congestion.     Sinus irritation.     Sore throat.     Loss of voice (laryngitis).     Cough.    Fatigue.    Muscle aches.     Loss of appetite.     Headache.    Low-grade fever.  DIAGNOSIS   You might diagnose your own cold based on familiar symptoms, since most people get a cold 2 to 3 times a year. Your caregiver can confirm this based on your exam. Most importantly, your caregiver can check that your symptoms are not due to another disease such as strep throat, sinusitis, pneumonia, asthma, or epiglottitis. Blood tests, throat tests, and X-rays are not necessary to diagnose a common cold, but they may sometimes be helpful in excluding other more  serious diseases. Your caregiver will decide if any further tests are required. RISKS AND COMPLICATIONS   You may be at risk for a more severe case of the common cold if you smoke cigarettes, have chronic heart disease (such as heart failure) or lung disease (such as asthma), or if you have a weakened immune system. The very young and very old are also at risk for more serious infections. Bacterial sinusitis, middle ear infections, and bacterial pneumonia can complicate the common cold. The common cold can worsen asthma and chronic obstructive pulmonary disease (COPD). Sometimes, these complications can require emergency medical care and may be life-threatening. PREVENTION   The best way to protect against getting a cold is to practice good hygiene. Avoid oral or hand contact with people with cold symptoms. Wash your hands often if contact occurs. There is no clear evidence that vitamin C, vitamin E, echinacea, or exercise reduces the chance of developing a cold. However, it is always recommended to get plenty of rest and practice good nutrition. TREATMENT   Treatment is directed at relieving symptoms. There is no cure. Antibiotics are not effective, because the infection is caused by a virus, not by bacteria. Treatment may include:  Increased fluid intake. Sports drinks offer valuable electrolytes, sugars, and fluids.     Breathing heated mist or steam (vaporizer or shower).  Eating chicken soup or other clear broths, and maintaining good nutrition.     Getting plenty of rest.     Using gargles or lozenges for comfort.     Controlling fevers with ibuprofen or acetaminophen as directed by your caregiver.     Increasing usage of your inhaler if you have asthma.  Zinc gel and zinc lozenges, taken in the first 24 hours of the common cold, can shorten the duration and lessen the severity of symptoms. Pain medicines may help with fever, muscle aches, and throat pain. A variety of non-prescription  medicines are available to treat congestion and runny nose. Your caregiver can make recommendations and may suggest nasal or lung inhalers for other symptoms.   HOME CARE INSTRUCTIONS    Only take over-the-counter or prescription medicines for pain, discomfort, or fever as directed by your caregiver.     Use a warm mist humidifier or inhale steam from a shower to increase air moisture. This may keep secretions moist and make it easier to breathe.     Drink enough water and fluids to keep your urine clear or pale yellow.     Rest as needed.     Return to work when your temperature has returned to normal or as your caregiver advises. You may need to stay home longer to avoid infecting others. You can also use a face mask and careful hand washing to prevent spread of the virus.  SEEK MEDICAL CARE IF:    After the first few days, you feel you are getting worse rather than better.     You need your caregiver's advice about medicines to control symptoms.     You develop chills, worsening shortness of breath, or brown or red sputum. These may be signs of pneumonia.     You develop yellow or brown nasal discharge or pain in the face, especially when you bend forward. These may be signs of sinusitis.     You develop a fever, swollen neck glands, pain with swallowing, or white areas in the back of your throat. These may be signs of strep throat.  SEEK IMMEDIATE MEDICAL CARE IF:    You have a fever.     You develop severe or persistent headache, ear pain, sinus pain, or chest pain.     You develop wheezing, a prolonged cough, cough up blood, or have a change in your usual mucus (if you have chronic lung disease).     You develop sore muscles or a stiff neck.  Document Released: 05/19/2001 Document Revised: 08/05/2011 Document Reviewed: 03/27/2011 [facility] Patient Information 2012 [place], [place].

## 2011-12-30 NOTE — Progress Notes (Signed)
  Subjective:    Patient ID: Brenda Ewing, female    DOB: 1981/05/13, 31 y.o.   MRN: [id number]  HPI Symptoms about 10 days now - uri Started as a bad cold with cong and st  Eda Paschal is a little better  Is coughing severely -esp at night  Elevated head of bed  Tried theraflu and ny quil - not helping much   Not prod  Had low grade fever first day only   Patient Active Problem List  Diagnoses  . VAGINAL DISCHARGE  . MENORRHAGIA  . DYSURIA  . Vaginal odor  . Routine general medical examination at a health care facility  . Insomnia  . Trapezius muscle spasm  . URI (upper respiratory infection)  . Neuropathy, leg  . Viral URI with cough   Past Medical History  Diagnosis Date  . Menorrhagia    Past Surgical History  Procedure Date  . Tonsillectomy    History  Substance Use Topics  . Smoking status: Never Smoker   . Smokeless tobacco: Not on file  . Alcohol Use:    Family History  Problem Relation Age of Onset  . Diabetes Other    No Known Allergies No current outpatient prescriptions on file prior to visit.         Review of Systems Review of Systems  Constitutional: Negative for fever, appetite change,  and unexpected weight change.  Eyes: Negative for pain and visual disturbance.  ENT pos for post nasal drip/ neg for sinus pain Respiratory: Negative for sob or wheeze  Cardiovascular: Negative for cp or palpitations    Gastrointestinal: Negative for nausea, diarrhea and constipation.  Genitourinary: Negative for urgency and frequency.  Skin: Negative for pallor or rash   Neurological: Negative for weakness, light-headedness, numbness and headaches.  Hematological: Negative for adenopathy. Does not bruise/bleed easily.  Psychiatric/Behavioral: Negative for dysphoric mood. The patient is not nervous/anxious.          Objective:   Physical Exam  Constitutional: She appears well-developed and well-nourished. No distress.       Obese/ fatigued  appearing hacky cough  HENT:  Head: Normocephalic and atraumatic.  Right Ear: External ear normal.  Left Ear: External ear normal.  Mouth/Throat: Oropharynx is clear and moist.       Nares boggy No sinus tenderness  Eyes: Conjunctivae and EOM are normal. Pupils are equal, round, and reactive to light. Right eye exhibits no discharge. Left eye exhibits no discharge.  Neck: Normal range of motion. Neck supple.  Cardiovascular: Normal rate, regular rhythm and normal heart sounds.   Pulmonary/Chest: Effort normal and breath sounds normal. No respiratory distress. She has no wheezes. She has no rales. She exhibits no tenderness.       Harsh bs  A few scattered rhonchi  Lymphadenopathy:    She has no cervical adenopathy.  Neurological: She is alert.  Skin: Skin is warm and dry. No rash noted. No pallor.  Psychiatric: She has a normal mood and affect.          Assessment & Plan:

## 2012-03-07 ENCOUNTER — Ambulatory Visit (INDEPENDENT_AMBULATORY_CARE_PROVIDER_SITE_OTHER): Payer: 59 | Admitting: Family Medicine

## 2012-03-07 ENCOUNTER — Encounter: Payer: Self-pay | Admitting: Family Medicine

## 2012-03-07 VITALS — BP 120/82 | HR 100 | Temp 98.7°F | Wt 249.0 lb

## 2012-03-07 DIAGNOSIS — J069 Acute upper respiratory infection, unspecified: Secondary | ICD-10-CM

## 2012-03-07 MED ORDER — HYDROCOD POLST-CHLORPHEN POLST 10-8 MG/5ML PO LQCR: 5.0000 mL | Freq: Two times a day (BID) | ORAL | Status: DC | PRN

## 2012-03-07 MED ORDER — AZITHROMYCIN 250 MG PO TABS: ORAL_TABLET | ORAL | Status: AC

## 2012-03-07 NOTE — Progress Notes (Signed)
SUBJECTIVE:  Brenda Ewing [place] is a 31 y.o. female who complains of coryza, productive cough and fever for 14 days. She denies a history of anorexia, chest pain and chills and denies a history of asthma. Patient denies smoke cigarettes.   Tmax 102.  Patient Active Problem List  Diagnoses  . VAGINAL DISCHARGE  . MENORRHAGIA  . DYSURIA  . Vaginal odor  . Routine general medical examination at a health care facility  . Insomnia  . Trapezius muscle spasm  . URI (upper respiratory infection)  . Neuropathy, leg  . Viral URI with cough   Past Medical History  Diagnosis Date  . Menorrhagia    Past Surgical History  Procedure Date  . Tonsillectomy    History  Substance Use Topics  . Smoking status: Never Smoker   . Smokeless tobacco: Not on file  . Alcohol Use:    Family History  Problem Relation Age of Onset  . Diabetes Other    No Known Allergies Current Outpatient Prescriptions on File Prior to Visit  Medication Sig Dispense Refill  . chlorpheniramine-HYDROcodone (TUSSIONEX PENNKINETIC ER) 10-8 MG/5ML LQCR Take 5 mLs by mouth every 12 (twelve) hours as needed.  140 mL  0  . Doxylamine-DM (VICKS NYQUIL COUGH PO) Take by mouth as directed.      . Norgestimate-Ethinyl Estradiol Triphasic 0.18/0.215/0.25 MG-35 MCG tablet Take 1 tablet by mouth daily.      Marland Kitchen Phenylephrine-Pheniramine-DM (THERAFLU COLD & COUGH PO) Take by mouth as directed.       The PMH, PSH, Social History, Family History, Medications, and allergies have been reviewed in [facility], and have been updated if relevant.  OBJECTIVE: BP 120/82  Pulse 100  Temp(Src) 98.7 F (37.1 Ewing) (Oral)  Wt 249 lb (112.946 kg)  She appears well, vital signs are as noted. Ears normal.  Throat and pharynx normal.  Neck supple. No adenopathy in the neck. Nose is congested. Sinuses non tender. The chest is clear, without wheezes or rales.  ASSESSMENT:  upper respiratory illness  PLAN: Given duration and progression of symptoms, will  treat for bacterial process/walking PNA with Zpack. Symptomatic therapy suggested: push fluids, rest and return office visit prn if symptoms persist or worsen.Call or return to clinic prn if these symptoms worsen or fail to improve as anticipated.

## 2012-03-07 NOTE — Patient Instructions (Signed)
Take Zpack as directed.  Drink lots of fluids.  Treat sympotmatically with Mucinex, nasal saline irrigation, and Tylenol/Ibuprofen.  Cough suppressant at night. Call if not improving as expected in 5-7 days.    

## 2013-01-24 ENCOUNTER — Ambulatory Visit (INDEPENDENT_AMBULATORY_CARE_PROVIDER_SITE_OTHER): Payer: 59 | Admitting: Family Medicine

## 2013-01-24 ENCOUNTER — Encounter: Payer: Self-pay | Admitting: Family Medicine

## 2013-01-24 VITALS — BP 118/72 | HR 82 | Temp 98.0°F | Ht 64.5 in | Wt 249.5 lb

## 2013-01-24 DIAGNOSIS — J069 Acute upper respiratory infection, unspecified: Secondary | ICD-10-CM

## 2013-01-24 MED ORDER — HYDROCOD POLST-CHLORPHEN POLST 10-8 MG/5ML PO LQCR: 5.0000 mL | Freq: Two times a day (BID) | ORAL | Status: DC | PRN

## 2013-01-24 MED ORDER — AMOXICILLIN 875 MG PO TABS: 875.0000 mg | ORAL_TABLET | Freq: Two times a day (BID) | ORAL | Status: DC

## 2013-01-24 NOTE — Patient Instructions (Signed)
Take amoxicillin as directed- 1 tablet twice daily for 10 days.  Drink lots of fluids.  Treat sympotmatically with Mucinex, nasal saline irrigation, and Tylenol/Ibuprofen. Cough suppressant at night. Call if not improving as expected in 5-7 days.

## 2013-01-24 NOTE — Progress Notes (Signed)
SUBJECTIVE:  Brenda Ewing is a 32 y.o. female who complains of coryza, congestion, sneezing, sore throat, dry cough and bilateral sinus pain for 8 days. She denies a history of anorexia and chest pain and denies a history of asthma. Patient denies smoke cigarettes.   Patient Active Problem List  Diagnosis  . VAGINAL DISCHARGE  . MENORRHAGIA  . DYSURIA  . Insomnia  . URI (upper respiratory infection)   Past Medical History  Diagnosis Date  . Menorrhagia    Past Surgical History  Procedure Laterality Date  . Tonsillectomy     History  Substance Use Topics  . Smoking status: Never Smoker   . Smokeless tobacco: Not on file  . Alcohol Use:    Family History  Problem Relation Age of Onset  . Diabetes Other    No Known Allergies Current Outpatient Prescriptions on File Prior to Visit  Medication Sig Dispense Refill  . Doxylamine-DM (VICKS NYQUIL COUGH PO) Take by mouth as directed.      . Norgestimate-Ethinyl Estradiol Triphasic 0.18/0.215/0.25 MG-35 MCG tablet Take 1 tablet by mouth daily.       No current facility-administered medications on file prior to visit.   The PMH, PSH, Social History, Family History, Medications, and allergies have been reviewed in [facility], and have been updated if relevant.  OBJECTIVE: BP 118/72  Pulse 82  Temp(Src) 98 F (36.7 C)  Ht 5' 4.5" (1.638 m)  Wt 249 lb 8 oz (113.172 kg)  BMI 42.18 kg/m2  SpO2 97%  LMP 11/21/2012  She appears well, vital signs are as noted. Ears normal.  Throat and pharynx normal.  Neck supple. No adenopathy in the neck. Nose is congested. Sinuses  tender. The chest is clear, without wheezes or rales.  ASSESSMENT:  sinusitis  PLAN: Given duration and progression of symptoms, will treat for bacterial sinusitis with amoxicillin.  Symptomatic therapy suggested: push fluids, rest and return office visit prn if symptoms persist or worsen.  Call or return to clinic prn if these symptoms worsen or fail to improve as  anticipated.

## 2013-04-24 IMAGING — CR DG KNEE COMPLETE 4+V*R*
5 series · 5 of 5 positions shown · non-contrast
Comparison: None.

CLINICAL DATA: Right leg numbness for 4 weeks.  No history of
trauma provided.

RIGHT KNEE - COMPLETE 4+ VIEW

[view not recorded (1 of 5)]
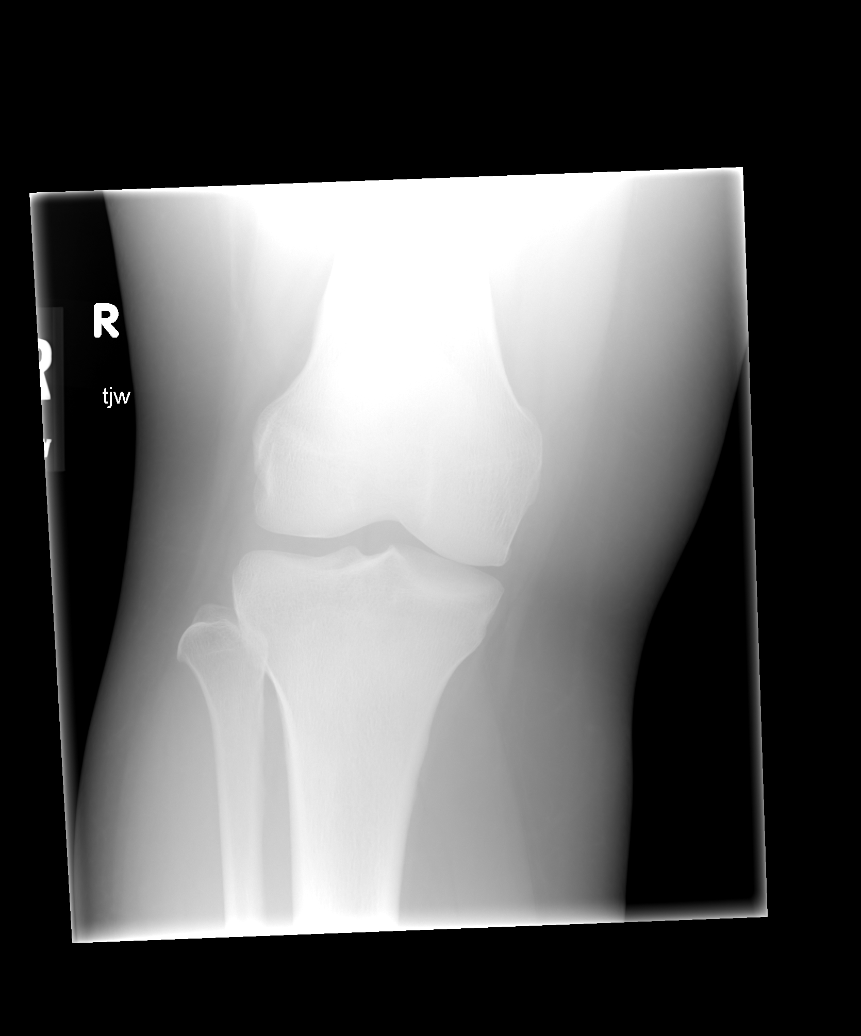

[view not recorded (2 of 5)]
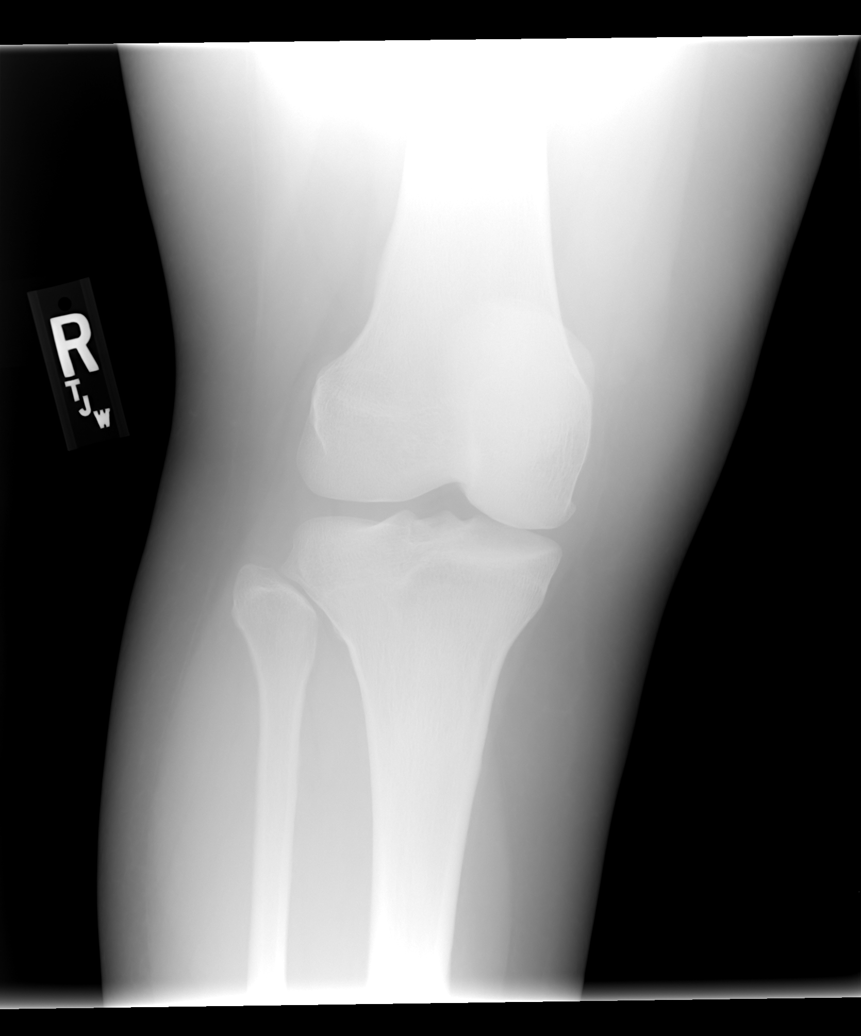

[view not recorded (3 of 5)]
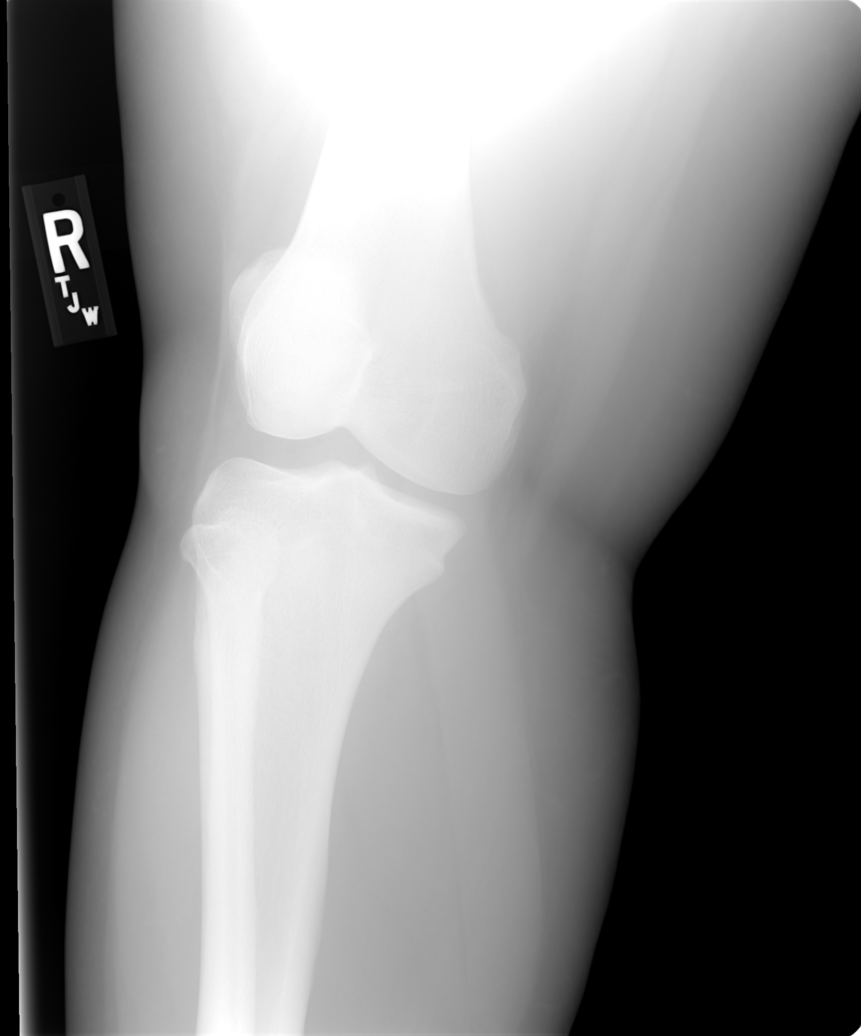

[view not recorded (4 of 5)]
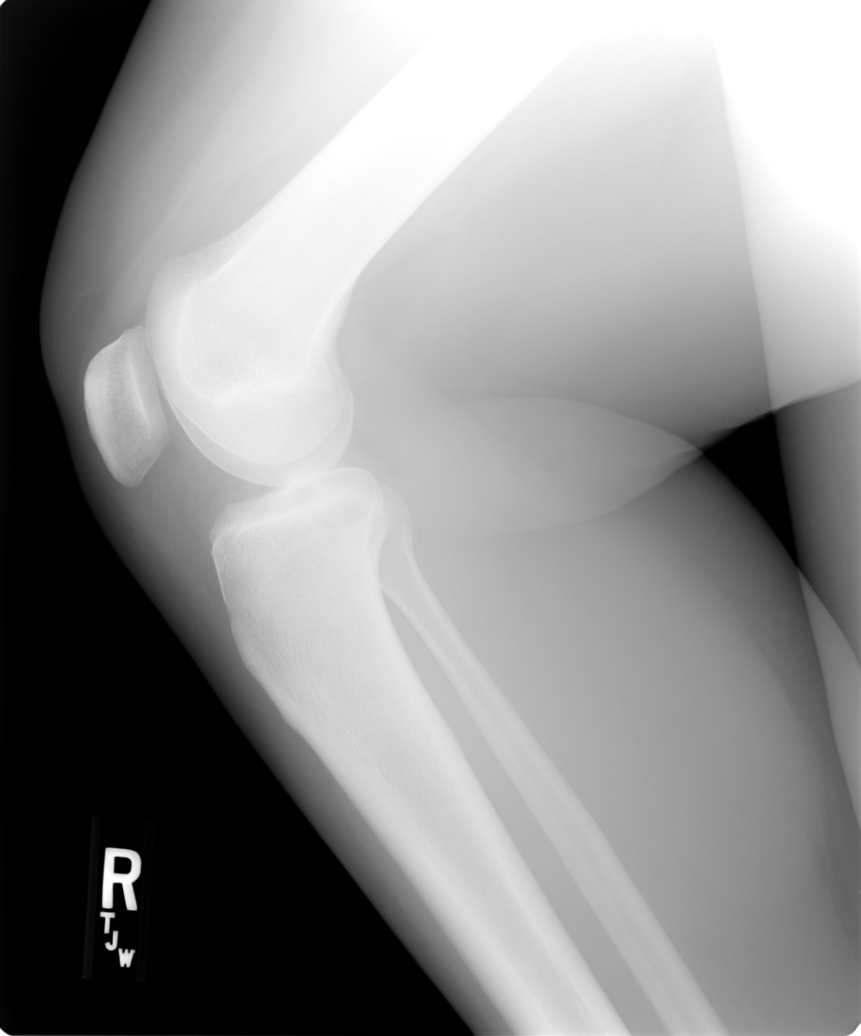

[view not recorded (5 of 5)]
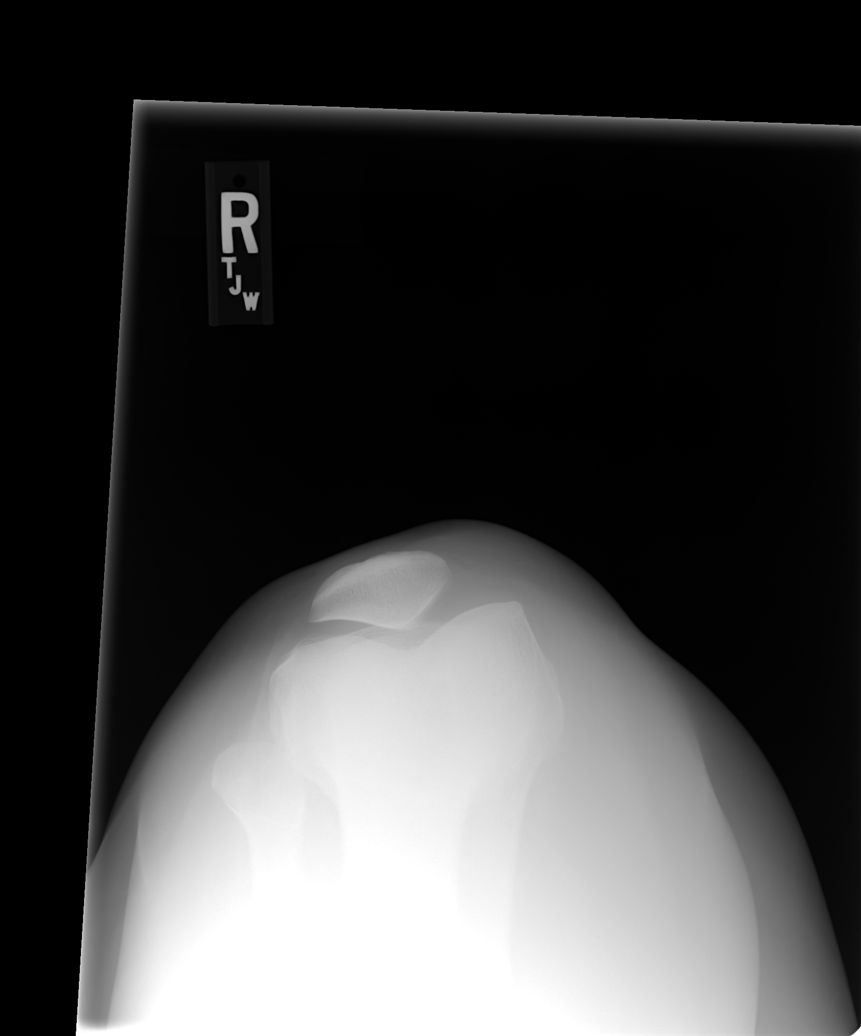

[5 of 5 positions shown; findings below may reference images not displayed]

FINDINGS: No fracture or dislocation.  No significant degenerative
changes/joint space narrowing. No bony destructive lesion.
IMPRESSION: Negative plain film exam.

## 2013-07-14 ENCOUNTER — Ambulatory Visit: Payer: 59 | Admitting: Family Medicine

## 2013-09-19 ENCOUNTER — Ambulatory Visit (INDEPENDENT_AMBULATORY_CARE_PROVIDER_SITE_OTHER): Payer: 59 | Admitting: Family Medicine

## 2013-09-19 ENCOUNTER — Encounter: Payer: Self-pay | Admitting: Family Medicine

## 2013-09-19 VITALS — BP 126/80 | HR 89 | Temp 99.5°F | Ht 64.5 in | Wt 252.0 lb

## 2013-09-19 DIAGNOSIS — J069 Acute upper respiratory infection, unspecified: Secondary | ICD-10-CM

## 2013-09-19 MED ORDER — HYDROCOD POLST-CHLORPHEN POLST 10-8 MG/5ML PO LQCR: 5.0000 mL | Freq: Every evening | ORAL | Status: DC | PRN

## 2013-09-19 NOTE — Assessment & Plan Note (Signed)
Symptomatic care, cough supressant prescription.

## 2013-09-19 NOTE — Patient Instructions (Addendum)
Mucinex DM during the day. Nasal saline spray 3 times daily. Cough supressant at night. Call if not improving in 5-7 days.

## 2013-09-19 NOTE — Progress Notes (Signed)
  Subjective:    Patient ID: Brenda Ewing [place], female    DOB: December 11, 1980, 32 y.o.   MRN: [id number]  URI  This is a new problem. The current episode started in the past 7 days. The problem has been gradually worsening. The maximum temperature recorded prior to her arrival was 101 - 101.9 F. The fever has been present for 1 to 2 days. Associated symptoms include congestion, coughing and a plugged ear sensation. Pertinent negatives include no chest pain, ear pain, headaches, neck pain, sinus pain, sneezing, sore throat, vomiting or wheezing. Associated symptoms comments: greenish sputum  ears full, body ache, fatigue  mild shortness breath with coughing and lying down at night.. Cough keeps her up at night.. She has tried acetaminophen (nyquil) for the symptoms. The treatment provided mild relief.      Review of Systems  HENT: Positive for congestion. Negative for ear pain, sneezing and sore throat.   Respiratory: Positive for cough. Negative for wheezing.   Cardiovascular: Negative for chest pain.  Gastrointestinal: Negative for vomiting.  Musculoskeletal: Negative for neck pain.  Neurological: Negative for headaches.       Objective:   Physical Exam  Constitutional: Vital signs are normal. She appears well-developed and well-nourished. She is cooperative.  Non-toxic appearance. She does not appear ill. No distress.  HENT:  Head: Normocephalic.  Right Ear: Hearing, tympanic membrane, external ear and ear canal normal. Tympanic membrane is not erythematous, not retracted and not bulging.  Left Ear: Hearing, tympanic membrane, external ear and ear canal normal. Tympanic membrane is not erythematous, not retracted and not bulging.  Nose: Mucosal edema and rhinorrhea present. Right sinus exhibits no maxillary sinus tenderness and no frontal sinus tenderness. Left sinus exhibits no maxillary sinus tenderness and no frontal sinus tenderness.  Mouth/Throat: Uvula is midline, oropharynx is clear  and moist and mucous membranes are normal.  Eyes: Conjunctivae, EOM and lids are normal. Pupils are equal, round, and reactive to light. Lids are everted and swept, no foreign bodies found.  Neck: Trachea normal and normal range of motion. Neck supple. Carotid bruit is not present. No mass and no thyromegaly present.  Cardiovascular: Normal rate, regular rhythm, S1 normal, S2 normal, normal heart sounds, intact distal pulses and normal pulses.  Exam reveals no gallop and no friction rub.   No murmur heard. Pulmonary/Chest: Effort normal and breath sounds normal. Not tachypneic. No respiratory distress. She has no decreased breath sounds. She has no wheezes. She has no rhonchi. She has no rales.  Neurological: She is alert.  Skin: Skin is warm, dry and intact. No rash noted.  Psychiatric: Her speech is normal and behavior is normal. Judgment normal. Her mood appears not anxious. Cognition and memory are normal. She does not exhibit a depressed mood.          Assessment & Plan:

## 2013-11-15 ENCOUNTER — Ambulatory Visit (INDEPENDENT_AMBULATORY_CARE_PROVIDER_SITE_OTHER): Payer: 59 | Admitting: Internal Medicine

## 2013-11-15 ENCOUNTER — Encounter: Payer: Self-pay | Admitting: Internal Medicine

## 2013-11-15 VITALS — BP 120/84 | HR 89 | Temp 98.5°F | Wt 255.0 lb

## 2013-11-15 DIAGNOSIS — B373 Candidiasis of vulva and vagina: Secondary | ICD-10-CM

## 2013-11-15 DIAGNOSIS — N760 Acute vaginitis: Secondary | ICD-10-CM

## 2013-11-15 DIAGNOSIS — B9689 Other specified bacterial agents as the cause of diseases classified elsewhere: Secondary | ICD-10-CM

## 2013-11-15 DIAGNOSIS — A499 Bacterial infection, unspecified: Secondary | ICD-10-CM

## 2013-11-15 MED ORDER — FLUCONAZOLE 150 MG PO TABS: 150.0000 mg | ORAL_TABLET | Freq: Once | ORAL | Status: DC

## 2013-11-15 MED ORDER — METRONIDAZOLE 0.75 % VA GEL: 1.0000 | Freq: Two times a day (BID) | VAGINAL | Status: DC

## 2013-11-15 NOTE — Progress Notes (Signed)
Subjective:    Patient ID: Brenda Ewing [place], female    DOB: January 07, 1981, 32 y.o.   MRN: [id number]  HPI  Pt presents to the clinic today with Ewing/o vaginal itching and white discharge. This started about 2-3 weeks ago. The discharge is thick and has a bad odor. She has not been sexually active in the last 6 months. She denies pelvic pain or abnormal bleeding. She has not douched. She did change her soap to [place] sensitive to see if that was the problem. She has also try wearing just cotton panties. Nothing has made a difference.  Review of Systems      Past Medical History  Diagnosis Date  . Menorrhagia     Current Outpatient Prescriptions  Medication Sig Dispense Refill  . Doxylamine-DM (VICKS NYQUIL COUGH PO) Take by mouth as directed.       No current facility-administered medications for this visit.    No Known Allergies  Family History  Problem Relation Age of Onset  . Diabetes Other     History   Social History  . Marital Status: Single    Spouse Name: N/A    Number of Children: N/A  . 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 Smoking status: Never Smoker   . Smokeless tobacco: Never Used  . Alcohol Use: Yes     Comment: occasional  . Drug Use: No  . Sexual Activity: Not on file   Other Topics Concern  . Not on file   Social History Narrative  . No narrative on file     Constitutional: Denies fever, malaise, fatigue, headache or abrupt weight changes.  GU: Denies urgency, frequency, pain with urination, burning sensation, blood in urine.   No other specific complaints in a complete review of systems (except as listed in HPI above).  Objective:   Physical Exam   BP 120/84  Pulse 89  Temp(Src) 98.5 F (36.9 Ewing)  Wt 255 lb (115.667 kg)  SpO2 99%  LMP 11/07/2013 Wt Readings from Last 3 Encounters:  11/15/13 255 lb (115.667 kg)  09/19/13 252 lb (114.306 kg)  01/24/13 249 lb 8 oz  (113.172 kg)    General: Appears her stated age, obese but well developed, well nourished in NAD. Cardiovascular: Normal rate and rhythm. S1,S2 noted.  No murmur, rubs or gallops noted. No JVD or BLE edema. No carotid bruits noted. Pulmonary/Chest: Normal effort and positive vesicular breath sounds. No respiratory distress. No wheezes, rales or ronchi noted.  Abdomen: Soft and nontender. Normal bowel sounds, no bruits noted. No distention or masses noted. Liver, spleen and kidneys non palpable.   BMET    Component Value Date/Time   NA 134* 02/26/2011 0914   K 4.3 02/26/2011 0914   CL 102 02/26/2011 0914   CO2 25 02/26/2011 0914   GLUCOSE 88 02/26/2011 0914   BUN 12 02/26/2011 0914   CREATININE 1.0 02/26/2011 0914   CALCIUM 9.0 02/26/2011 0914    Lipid Panel     Component Value Date/Time   CHOL 163 02/26/2011 0914   TRIG 102.0 02/26/2011 0914   HDL 60.70 02/26/2011 0914   CHOLHDL 3 02/26/2011 0914   VLDL 20.4 02/26/2011 0914   LDLCALC 82 02/26/2011 0914          Assessment & Plan:   Vaginal discharge with odor secondary to BV and yeast infection:  POCT wet prep- shows clues cells  and yeast eRx for metrogel vaginal suppositories BID x 5 days eRx for diflucan 150 mg PO x 1 Ci=ontinue to use Dove Sensitive soap  RTC as needed

## 2013-11-15 NOTE — Patient Instructions (Signed)
Bacterial Vaginosis Bacterial vaginosis (BV) is a vaginal infection where the normal balance of bacteria in the vagina is disrupted. The normal balance is then replaced by an overgrowth of certain bacteria. There are several different kinds of bacteria that can cause BV. BV is the most common vaginal infection in women of childbearing age. CAUSES   The cause of BV is not fully understood. BV develops when there is an increase or imbalance of harmful bacteria.  Some activities or behaviors can upset the normal balance of bacteria in the vagina and put women at increased risk including:  Having a new sex partner or multiple sex partners.  Douching.  Using an intrauterine device (IUD) for contraception.  It is not clear what role sexual activity plays in the development of BV. However, women that have never had sexual intercourse are rarely infected with BV. Women do not get BV from toilet seats, bedding, swimming pools or from touching objects around them.  SYMPTOMS   Grey vaginal discharge.  A fish-like odor with discharge, especially after sexual intercourse.  Itching or burning of the vagina and vulva.  Burning or pain with urination.  Some women have no signs or symptoms at all. DIAGNOSIS  Your caregiver must examine the vagina for signs of BV. Your caregiver will perform lab tests and look at the sample of vaginal fluid through a microscope. They will look for bacteria and abnormal cells (clue cells), a pH test higher than 4.5, and a positive amine test all associated with BV.  RISKS AND COMPLICATIONS   Pelvic inflammatory disease (PID).  Infections following gynecology surgery.  Developing HIV.  Developing herpes virus. TREATMENT  Sometimes BV will clear up without treatment. However, all women with symptoms of BV should be treated to avoid complications, especially if gynecology surgery is planned. Female partners generally do not need to be treated. However, BV may spread  between female sex partners so treatment is helpful in preventing a recurrence of BV.   BV may be treated with antibiotics. The antibiotics come in either pill or vaginal cream forms. Either can be used with nonpregnant or pregnant women, but the recommended dosages differ. These antibiotics are not harmful to the baby.  BV can recur after treatment. If this happens, a second round of antibiotics will often be prescribed.  Treatment is important for pregnant women. If not treated, BV can cause a premature delivery, especially for a pregnant woman who had a premature birth in the past. All pregnant women who have symptoms of BV should be checked and treated.  For chronic reoccurrence of BV, treatment with a type of prescribed gel vaginally twice a week is helpful. HOME CARE INSTRUCTIONS   Finish all medication as directed by your caregiver.  Do not have sex until treatment is completed.  Tell your sexual partner that you have a vaginal infection. They should see their caregiver and be treated if they have problems, such as a mild rash or itching.  Practice safe sex. Use condoms. Only have 1 sex partner. PREVENTION  Basic prevention steps can help reduce the risk of upsetting the natural balance of bacteria in the vagina and developing BV:  Do not have sexual intercourse (be abstinent).  Do not douche.  Use all of the medicine prescribed for treatment of BV, even if the signs and symptoms go away.  Tell your sex partner if you have BV. That way, they can be treated, if needed, to prevent reoccurrence. SEEK MEDICAL CARE IF:     Your symptoms are not improving after 3 days of treatment.  You have increased discharge, pain, or fever. MAKE SURE YOU:   Understand these instructions.  Will watch your condition.  Will get help right away if you are not doing well or get worse. FOR MORE INFORMATION  Division of STD Prevention (DSTDP), Centers for Disease Control and Prevention:  www.cdc.gov/std American Social Health Association (ASHA): www.ashastd.org  Document Released: 11/23/2005 Document Revised: 02/15/2012 Document Reviewed: 07/05/2013 ExitCare Patient Information 2014 [employer].  

## 2013-11-15 NOTE — Progress Notes (Signed)
Pre-visit discussion using our clinic review tool. No additional management support is needed unless otherwise documented below in the visit note.  

## 2013-11-28 ENCOUNTER — Encounter: Payer: Self-pay | Admitting: Internal Medicine

## 2013-11-28 ENCOUNTER — Ambulatory Visit (INDEPENDENT_AMBULATORY_CARE_PROVIDER_SITE_OTHER): Payer: 59 | Admitting: Internal Medicine

## 2013-11-28 VITALS — BP 120/84 | HR 84 | Temp 98.0°F | Ht 65.0 in | Wt 257.5 lb

## 2013-11-28 DIAGNOSIS — R05 Cough: Secondary | ICD-10-CM

## 2013-11-28 DIAGNOSIS — J208 Acute bronchitis due to other specified organisms: Secondary | ICD-10-CM

## 2013-11-28 DIAGNOSIS — J209 Acute bronchitis, unspecified: Secondary | ICD-10-CM

## 2013-11-28 DIAGNOSIS — R059 Cough, unspecified: Secondary | ICD-10-CM

## 2013-11-28 MED ORDER — HYDROCODONE-HOMATROPINE 5-1.5 MG/5ML PO SYRP: 5.0000 mL | ORAL_SOLUTION | Freq: Three times a day (TID) | ORAL | Status: DC | PRN

## 2013-11-28 NOTE — Progress Notes (Signed)
Pre-visit discussion using our clinic review tool. No additional management support is needed unless otherwise documented below in the visit note.  

## 2013-11-28 NOTE — Progress Notes (Signed)
HPI  Pt presents to the clinic today with c/o cough x 2 weeks. She reports this originally started as a cold. About 1 week ago, she had a productive cough, fever and body aches. She took mucinex, robitussin and tylenol. She reports that she is feeling better from the cold but she continues to have this annoying dry cough which will not go away. She has had sick contacts.  Review of Systems      Past Medical History  Diagnosis Date  . Menorrhagia     Family History  Problem Relation Age of Onset  . Diabetes Other     History   Social History  . Marital Status: Single    Spouse Name: N/A    Number of Children: N/A  . 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 Smoking status: Never Smoker   . Smokeless tobacco: Never Used  . Alcohol Use: Yes     Comment: occasional  . Drug Use: No  . Sexual Activity: Not on file   Other Topics Concern  . Not on file   Social History Narrative  . No narrative on file    No Known Allergies   Constitutional:  Denies headache, fatigue, fever or  abrupt weight changes.  HEENT:  Positive sore throat. Denies eye redness, eye pain, pressure behind the eyes, facial pain, nasal congestion, ear pain, ringing in the ears, wax buildup, runny nose or bloody nose. Respiratory: Positive cough. Denies difficulty breathing or shortness of breath.  Cardiovascular: Denies chest pain, chest tightness, palpitations or swelling in the hands or feet.   No other specific complaints in a complete review of systems (except as listed in HPI above).  Objective:   BP 120/84  Pulse 84  Temp(Src) 98 F (36.7 C) (Oral)  Ht 5\' 5"  (1.651 m)  Wt 257 lb 8 oz (116.801 kg)  BMI 42.85 kg/m2  SpO2 99%  LMP 11/07/2013 Wt Readings from Last 3 Encounters:  11/28/13 257 lb 8 oz (116.801 kg)  11/15/13 255 lb (115.667 kg)  09/19/13 252 lb (114.306 kg)     General: Appears her stated age, well developed,  well nourished in NAD. HEENT: Head: normal shape and size; Eyes: sclera white, no icterus, conjunctiva pink, PERRLA and EOMs intact; Ears: Tm's gray and intact, normal light reflex; Nose: mucosa pink and moist, septum midline; Throat/Mouth: + PND. Teeth present, mucosa erythematous and moist, no exudate noted, no lesions or ulcerations noted.  Neck:  Neck supple, trachea midline. No massses, lumps or thyromegaly present.  Cardiovascular: Normal rate and rhythm. S1,S2 noted.  No murmur, rubs or gallops noted. No JVD or BLE edema. No carotid bruits noted. Pulmonary/Chest: Normal effort and positive vesicular breath sounds. No respiratory distress. No wheezes, rales or ronchi noted.      Assessment & Plan:   Cough, likely due to acute viral bronchitis  Get some rest and drink plenty of water Do salt water gargles for the sore throat eRx for Hycodan cough syrup  RTC as needed or if symptoms persist.

## 2013-11-28 NOTE — Patient Instructions (Signed)

## 2013-11-29 ENCOUNTER — Telehealth: Payer: Self-pay

## 2013-11-29 MED ORDER — HYDROCOD POLST-CHLORPHEN POLST 10-8 MG/5ML PO LQCR: 5.0000 mL | Freq: Two times a day (BID) | ORAL | Status: DC | PRN

## 2013-11-29 NOTE — Telephone Encounter (Signed)
Pt notified Rx ready for pick-up and advise of Dr. Royden Purl comments

## 2013-11-29 NOTE — Telephone Encounter (Signed)
Pt left v/m; was seen 11/28/13 and Hycodan has not helped dry cough. Pt said did not sleep last night due to coughing. Pt said chest hurts from the coughing. Pt request different med for cough. [facility]. Pt request cb.

## 2013-11-29 NOTE — Telephone Encounter (Signed)
We can move up to tussionex - is stronger but use extreme caution for sedation and habit forming potential  F/u next week if no improvement - call if fever or other symptoms  Px printed for pick up in IN box

## 2013-12-04 ENCOUNTER — Telehealth: Payer: Self-pay | Admitting: Family Medicine

## 2013-12-04 ENCOUNTER — Other Ambulatory Visit: Payer: Self-pay | Admitting: Internal Medicine

## 2013-12-04 MED ORDER — HYDROCOD POLST-CHLORPHEN POLST 10-8 MG/5ML PO LQCR: 5.0000 mL | Freq: Two times a day (BID) | ORAL | Status: DC | PRN

## 2013-12-04 MED ORDER — AZITHROMYCIN 250 MG PO TABS: ORAL_TABLET | ORAL | Status: DC

## 2013-12-04 NOTE — Telephone Encounter (Signed)
Pt was in last week and the antibiotic she was given is not helping. Please advise. [facility] [address]

## 2013-12-04 NOTE — Telephone Encounter (Signed)
Pt is aware of Ax being sent in. However, pt states that she is still coughing a lot and it is keeping her up at night. Pt wants to know if she can get a refill on the cough syrup or can she try something else--please advise

## 2013-12-04 NOTE — Telephone Encounter (Signed)
Ok to refill cough syrup but she will have to come pick it up. Can you print it out for me to sign

## 2013-12-04 NOTE — Telephone Encounter (Signed)
zpack called in.

## 2013-12-04 NOTE — Telephone Encounter (Signed)
Rx left in front office for pick up and pt is aware  

## 2013-12-11 ENCOUNTER — Telehealth: Payer: Self-pay

## 2013-12-11 DIAGNOSIS — B373 Candidiasis of vulva and vagina: Secondary | ICD-10-CM

## 2013-12-11 DIAGNOSIS — B3731 Acute candidiasis of vulva and vagina: Secondary | ICD-10-CM

## 2013-12-11 MED ORDER — FLUCONAZOLE 150 MG PO TABS: 150.0000 mg | ORAL_TABLET | Freq: Once | ORAL | Status: DC

## 2013-12-11 NOTE — Telephone Encounter (Signed)
Rx sent.  If symptoms persists, needs to be evaluated.

## 2013-12-11 NOTE — Telephone Encounter (Signed)
Spoke to pt and informed her that Rx has been sent, but if s/s persist she will need to be evaluated. Pt verbally expressed understanding

## 2013-12-11 NOTE — Telephone Encounter (Signed)
Pt left v/m; pt has finished taking antibiotic and now pt has yeast infection,vaginal odor, doesn't think vaginal discharge and has perineal itching and request [facility]an [facility]. Pt said the worse symptoms is the vaginal odor. Pt request cb.

## 2014-01-04 ENCOUNTER — Ambulatory Visit (INDEPENDENT_AMBULATORY_CARE_PROVIDER_SITE_OTHER): Payer: 59 | Admitting: Family Medicine

## 2014-01-04 ENCOUNTER — Encounter: Payer: Self-pay | Admitting: Family Medicine

## 2014-01-04 VITALS — BP 126/76 | HR 77 | Temp 98.0°F | Ht 65.0 in | Wt 257.8 lb

## 2014-01-04 DIAGNOSIS — R059 Cough, unspecified: Secondary | ICD-10-CM

## 2014-01-04 DIAGNOSIS — R05 Cough: Secondary | ICD-10-CM

## 2014-01-04 MED ORDER — PREDNISONE (PAK) 10 MG PO TABS: ORAL_TABLET | Freq: Every day | ORAL | Status: DC

## 2014-01-04 MED ORDER — HYDROCOD POLST-CHLORPHEN POLST 10-8 MG/5ML PO LQCR: 5.0000 mL | Freq: Two times a day (BID) | ORAL | Status: DC | PRN

## 2014-01-04 NOTE — Progress Notes (Signed)
Pre-visit discussion using our clinic review tool. No additional management support is needed unless otherwise documented below in the visit note.  

## 2014-01-04 NOTE — Patient Instructions (Signed)
Great to see you. Please take prednisone as directed in the morning and with food- 6 days total. Tussionex as directed. Please do not take ibuprofen, alleve or other NSAIDs while taking prednisone but you can take Tylenol.  Call me next week with an update.

## 2014-01-04 NOTE — Progress Notes (Signed)
SUBJECTIVE:  Brenda Ewing is a 33 y.o. female [place] of constant dry cough for over a month. Saw Brenda Ewing on 12/23- advised likely viral and given Hycodan.  Cough persists, so she called back and was given Tussionex.  Tussionex did work at night but still coughing all day.  She denies a history of anorexia, chest pain, chills, dizziness, fatigue, fevers, myalgias, shortness of breath, sweats, vomiting, weakness and weight loss and denies a history of asthma. Patient denies smoke cigarettes.   Patient Active Problem List   Diagnosis Date Noted  . Insomnia 03/02/2011  . MENORRHAGIA 09/29/2010   Past Medical History  Diagnosis Date  . Menorrhagia    Past Surgical History  Procedure Laterality Date  . Tonsillectomy     History  Substance Use Topics  . Smoking status: Never Smoker   . Smokeless tobacco: Never Used  . Alcohol Use: Yes     Comment: occasional   Family History  Problem Relation Age of Onset  . Diabetes Other    No Known Allergies No current outpatient prescriptions on file prior to visit.   No current facility-administered medications on file prior to visit.    The PMH, PSH, Social History, Family History, Medications, and allergies [facility], and have been updated if relevant.  OBJECTIVE: BP 126/76  Pulse 77  Temp(Src) 98 F (36.7 C) (Oral)  Ht 5\' 5"  (1.651 m)  Wt 257 lb 12 oz (116.915 kg)  BMI 42.89 kg/m2  SpO2 98%  LMP 12/14/2013  She appears well, vital signs are as noted. Ears normal.  Throat and pharynx normal.  Neck supple. No adenopathy in the neck. Nose is congested. Sinuses non tender. The chest is clear, without wheezes or rales.  ASSESSMENT:  viral upper respiratory illness- persistent cough  PLAN: Trial of prednisone dose pack, tussionex prn cough. Symptomatic therapy suggested: push fluids, rest and return office visit prn if symptoms persist or worsen. Lack of antibiotic effectiveness discussed with her. Call or  return to clinic prn if these symptoms worsen or fail to improve as anticipated.

## 2014-08-22 ENCOUNTER — Encounter: Payer: Self-pay | Admitting: Family Medicine

## 2014-08-22 ENCOUNTER — Ambulatory Visit (INDEPENDENT_AMBULATORY_CARE_PROVIDER_SITE_OTHER): Payer: 59 | Admitting: Family Medicine

## 2014-08-22 VITALS — BP 120/70 | HR 92 | Temp 98.2°F | Wt 259.5 lb

## 2014-08-22 DIAGNOSIS — H9201 Otalgia, right ear: Secondary | ICD-10-CM | POA: Insufficient documentation

## 2014-08-22 DIAGNOSIS — H9209 Otalgia, unspecified ear: Secondary | ICD-10-CM

## 2014-08-22 DIAGNOSIS — H9202 Otalgia, left ear: Secondary | ICD-10-CM

## 2014-08-22 NOTE — Progress Notes (Signed)
   Subjective:   Patient ID: Brenda Ewing, female    DOB: April 01, 1981, 33 y.o.   MRN: [id number]  Brenda Ewing is a pleasant 33 y.o. year old female who presents to clinic today with Otalgia  on 08/22/2014  HPI: 33 yo here for 3 months of intermittent right ear pain.  No other URI symptoms.  Has been swimming more. Ear not painful to touch or manipulation. No hearing loss but does feel "muffled."  No current outpatient prescriptions on file prior to visit.   No current facility-administered medications on file prior to visit.    No Known Allergies  Past Medical History  Diagnosis Date  . Menorrhagia     Past Surgical History  Procedure Laterality Date  . Tonsillectomy      Family History  Problem Relation Age of Onset  . Diabetes Other     History   Social History  . Marital Status: Single    Spouse Name: N/A    Number of Children: N/A  . 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 Smoking status: Never Smoker   . Smokeless tobacco: Never Used  . Alcohol Use: Yes     Comment: occasional  . Drug Use: No  . Sexual Activity: Not on file   Other Topics Concern  . Not on file   Social History Narrative  . No narrative on file   The PMH, PSH, Social History, Family History, Medications, and allergies have been reviewed in [facility], and have been updated if relevant.    Review of Systems    See HPI No runny nose No cough  No SOB Objective:    BP 120/70  Pulse 92  Temp(Src) 98.2 F (36.8 C) (Oral)  Wt 259 lb 8 oz (117.708 kg)  SpO2 97%  LMP 07/24/2014   Physical Exam  Nursing note and vitals reviewed. Constitutional: She appears well-developed and well-nourished. No distress.  HENT:  Head: Normocephalic.  Right Ear: No drainage, swelling or tenderness. No mastoid tenderness. Tympanic membrane is not perforated, not erythematous, not retracted and not bulging. Tympanic membrane  mobility is normal. A middle ear effusion is present. No decreased hearing is noted.  Left Ear: Tympanic membrane and ear canal normal.  Skin: Skin is warm and dry.  Psychiatric: She has a normal mood and affect. Her speech is normal and behavior is normal. Thought content normal.          Assessment & Plan:   Left ear pain No Follow-up on file.

## 2014-08-22 NOTE — Patient Instructions (Signed)
  Great to see you. Try taking a decongestant like claritin D or plain sudafed.  Sometimes a nasal spray can try. Try over the counter nasocort-start with 2 sprays per nostril per day...and then try to taper to 1 spray per nostril once symptoms improve.     Call if not improving as expected in 5-7 days.

## 2014-08-22 NOTE — Assessment & Plan Note (Signed)
New- consistent with ETD. Advised supportive care with steroid nasal spray and oral decongestant- see AVS. Call or return to clinic prn if these symptoms worsen or fail to improve as anticipated. The patient indicates understanding of these issues and agrees with the plan.

## 2014-08-22 NOTE — Progress Notes (Signed)
Pre visit review using our clinic review tool, if applicable. No additional management support is needed unless otherwise documented below in the visit note. 

## 2014-09-27 ENCOUNTER — Telehealth: Payer: Self-pay | Admitting: Family Medicine

## 2014-09-27 NOTE — Telephone Encounter (Signed)
Lm on pts vm informing her that she will need an OV for completion of paperwork. Pt has never been seen for weight loss mngt.

## 2014-09-27 NOTE — Telephone Encounter (Signed)
Pt dropped off appeal form for her insurance. She is now on the [employer] weightloss program. Will put inbox. [facility]

## 2014-10-02 ENCOUNTER — Encounter: Payer: Self-pay | Admitting: Family Medicine

## 2014-10-02 ENCOUNTER — Ambulatory Visit (INDEPENDENT_AMBULATORY_CARE_PROVIDER_SITE_OTHER): Payer: 59 | Admitting: Family Medicine

## 2014-10-02 DIAGNOSIS — G4700 Insomnia, unspecified: Secondary | ICD-10-CM

## 2014-10-02 NOTE — Assessment & Plan Note (Signed)
Deteriorated. Encouraged her to continue working on her lifestyle changes.  She will be checking routine labs, including thyroid function in a few weeks to help us look into other contributing factors.

## 2014-10-02 NOTE — Progress Notes (Signed)
   Subjective:   Patient ID: Brenda Ewing, female    DOB: 06/29/1981, 33 y.o.   MRN: [id number]  Brenda Ewing is a pleasant 33 y.o. year old female who presents to clinic today with Obesity and Insomnia  on 10/02/2014  HPI:  Obesity- has been an issue for years.  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  Brings in form from her employer to defer BMI goal.  No results found for this basename: TSH   Insomnia- has had issues falling and staying asleep since childhood, even when she was much thinner.  She is concerned that her lack of sleep is worsening her obesity. Takes Unisom nightly- used to work, now wakes up 2 or 3 hours after taking it.  Does not think she snores much and no one has ever told her that she stops breathing at night. She is tired constantly.  No current outpatient prescriptions on file prior to visit.   No current facility-administered medications on file prior to visit.    No Known Allergies  Past Medical History  Diagnosis Date  . Menorrhagia     Past Surgical History  Procedure Laterality Date  . Tonsillectomy      Family History  Problem Relation Age of Onset  . Diabetes Other     History   Social History  . Marital Status: Single    Spouse Name: N/A    Number of Children: N/A  . 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 Smoking status: Never Smoker   . Smokeless tobacco: Never Used  . Alcohol Use: Yes     Comment: occasional  . Drug Use: No  . Sexual Activity: Not on file   Other Topics Concern  . Not on file   Social History Narrative  . No narrative on file   The PMH, PSH, Social History, Family History, Medications, and allergies [facility], and have been updated if relevant.   Review of Systems  Constitutional: Negative.   HENT: Negative.   Respiratory: Negative.   Cardiovascular:  Negative.   Psychiatric/Behavioral: Positive for sleep disturbance.  All other systems reviewed and are negative.      Objective:    BP 126/92  Pulse 87  Temp(Src) 98.1 F (36.7 C) (Oral)  Ht 5\' 5"  (1.651 m)  Wt 260 lb (117.935 kg)  BMI 43.27 kg/m2  SpO2 97% Wt Readings from Last 3 Encounters:  10/02/14 260 lb (117.935 kg)  08/22/14 259 lb 8 oz (117.708 kg)  01/04/14 257 lb 12 oz (116.915 kg)     Physical Exam  Nursing note and vitals reviewed. Constitutional: She is oriented to person, place, and time. She appears well-developed and well-nourished. No distress.  HENT:  Head: Normocephalic and atraumatic.  Neurological: She is alert and oriented to person, place, and time.  Skin: Skin is warm and dry.  Psychiatric: She has a normal mood and affect. Her behavior is normal. Judgment and thought content normal.          Assessment & Plan:   Severe obesity (BMI >= 40) No Follow-up on file.

## 2014-10-02 NOTE — Assessment & Plan Note (Signed)
>  25 minutes spent in face to face time with patient, >50% spent in counselling or coordination of care Persistent issue.  >25 min spent with patient, at least half of which was spent on counseling insomnia.  The problem of recurrent insomnia is discussed. Avoidance of caffeine sources is strongly encouraged. Sleep hygiene issues are reviewed.  She has not tried melatonin or benadryl.  Advised to stop unisom and try one or both of these sleep aids. Call or return to clinic prn if these symptoms worsen or fail to improve as anticipated. The patient indicates understanding of these issues and agrees with the plan.

## 2014-10-02 NOTE — Patient Instructions (Signed)
It was great to see you. Try benadryl and or melatonin for insomnia.

## 2014-10-02 NOTE — Progress Notes (Signed)
Pre visit review using our clinic review tool, if applicable. No additional management support is needed unless otherwise documented below in the visit note. 

## 2014-10-17 ENCOUNTER — Ambulatory Visit (INDEPENDENT_AMBULATORY_CARE_PROVIDER_SITE_OTHER): Payer: 59 | Admitting: Family Medicine

## 2014-10-17 ENCOUNTER — Encounter: Payer: Self-pay | Admitting: Family Medicine

## 2014-10-17 VITALS — BP 122/80 | HR 72 | Temp 98.6°F | Ht 65.0 in | Wt 264.0 lb

## 2014-10-17 DIAGNOSIS — Z01419 Encounter for gynecological examination (general) (routine) without abnormal findings: Secondary | ICD-10-CM

## 2014-10-17 DIAGNOSIS — G4700 Insomnia, unspecified: Secondary | ICD-10-CM

## 2014-10-17 DIAGNOSIS — Z0000 Encounter for general adult medical examination without abnormal findings: Secondary | ICD-10-CM

## 2014-10-17 LAB — COMPREHENSIVE METABOLIC PANEL
ALBUMIN: 3.1 g/dL — AB (ref 3.5–5.2)
ALT: 14 U/L (ref 0–35)
AST: 14 U/L (ref 0–37)
Alkaline Phosphatase: 38 U/L — ABNORMAL LOW (ref 39–117)
BUN: 8 mg/dL (ref 6–23)
CALCIUM: 8.7 mg/dL (ref 8.4–10.5)
CHLORIDE: 105 meq/L (ref 96–112)
CO2: 22 mEq/L (ref 19–32)
Creatinine, Ser: 1 mg/dL (ref 0.4–1.2)
GFR: 84.18 mL/min (ref >60.00)
GLUCOSE: 92 mg/dL (ref 70–99)
POTASSIUM: 4 meq/L (ref 3.5–5.1)
Sodium: 137 mEq/L (ref 135–145)
Total Bilirubin: 0.2 mg/dL (ref 0.2–1.2)
Total Protein: 6.8 g/dL (ref 6.0–8.3)

## 2014-10-17 LAB — CBC WITH DIFFERENTIAL/PLATELET
BASOS PCT: 0.5 % (ref 0.0–3.0)
Basophils Absolute: 0 10*3/uL (ref 0.0–0.1)
EOS ABS: 0.2 10*3/uL (ref 0.0–0.7)
Eosinophils Relative: 4.2 % (ref 0.0–5.0)
HEMATOCRIT: 36.2 % (ref 36.0–46.0)
HEMOGLOBIN: 11.5 g/dL — AB (ref 12.0–15.0)
LYMPHS ABS: 1.7 10*3/uL (ref 0.7–4.0)
LYMPHS PCT: 32.3 % (ref 12.0–46.0)
MCHC: 31.8 g/dL (ref 30.0–36.0)
MCV: 82.9 fl (ref 78.0–100.0)
MONOS PCT: 6 % (ref 3.0–12.0)
Monocytes Absolute: 0.3 10*3/uL (ref 0.1–1.0)
NEUTROS ABS: 3.1 10*3/uL (ref 1.4–7.7)
NEUTROS PCT: 57 % (ref 43.0–77.0)
Platelets: 246 10*3/uL (ref 150.0–400.0)
RBC: 4.36 Mil/uL (ref 3.87–5.11)
RDW: 15.9 % — AB (ref 11.5–15.5)
WBC: 5.4 10*3/uL (ref 4.0–10.5)

## 2014-10-17 LAB — LIPID PANEL
CHOLESTEROL: 152 mg/dL (ref 0–200)
HDL: 50 mg/dL (ref >39.00)
LDL Cholesterol: 84 mg/dL (ref 0–99)
NonHDL: 102
TRIGLYCERIDES: 90 mg/dL (ref 0.0–149.0)
Total CHOL/HDL Ratio: 3
VLDL: 18 mg/dL (ref 0.0–40.0)

## 2014-10-17 LAB — TSH: TSH: 2.96 u[IU]/mL (ref 0.35–4.50)

## 2014-10-17 MED ORDER — TRAZODONE HCL 50 MG PO TABS: 25.0000 mg | ORAL_TABLET | Freq: Every evening | ORAL | Status: DC | PRN

## 2014-10-17 NOTE — Assessment & Plan Note (Signed)
Reviewed preventive care protocols, scheduled due services, and updated immunizations Discussed nutrition, exercise, diet, and healthy lifestyle.  Orders Placed This Encounter  Procedures  . CBC with Differential  . Comprehensive metabolic panel  . Lipid panel  . TSH    

## 2014-10-17 NOTE — Assessment & Plan Note (Signed)
Deteriorated. Check lab work today. Advised keeping log of daily caloric intake like My[employer]itness Pal app.

## 2014-10-17 NOTE — Assessment & Plan Note (Signed)
Persistent.  The problem of recurrent insomnia is discussed again. Avoidance of caffeine sources is strongly encouraged. Sleep hygiene issues are reviewed.   Will add Trazodone 25-50 mg qhs prn insomnia. Call or return to clinic prn if these symptoms worsen or fail to improve as anticipated. The patient indicates understanding of these issues and agrees with the plan.

## 2014-10-17 NOTE — Progress Notes (Signed)
Pre visit review using our clinic review tool, if applicable. No additional management support is needed unless otherwise documented below in the visit note. 

## 2014-10-17 NOTE — Addendum Note (Signed)
Addended by: Baldomero LamyHAVERS, Maddux Vanscyoc C on: 10/17/2014 04:16 PM   Modules accepted: Kipp BroodSmartSet

## 2014-10-17 NOTE — Progress Notes (Signed)
Subjective:   Patient ID: Brenda Ewing, female    DOB: 10/31/1981, 33 y.o.   MRN: [id number]  Brenda Ewing is a pleasant 33 y.o. year old female who presents to clinic today with Annual Exam  on 10/17/2014  HPI:  Originally wanted to have pap smear done today but has appt with GYN for 11/06/14.  Obesity- has been an issue for years.  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  Feels she gains more weight as she continues to exercise. Wt Readings from Last 3 Encounters:  10/17/14 264 lb (119.75 kg)  10/02/14 260 lb (117.935 kg)  08/22/14 259 lb 8 oz (117.708 kg)     Insomnia- has had issues falling and staying asleep since childhood, even when she was much thinner.  She is concerned that her lack of sleep is worsening her obesity. When I saw her a couple of weeks ago, advised trying melatonin and/or benadryl.  She tried both without results.  Does not think she snores much and no one has ever told her that she stops breathing at night. She is tired constantly.  No current outpatient prescriptions on file prior to visit.   No current facility-administered medications on file prior to visit.    No Known Allergies  Past Medical History  Diagnosis Date  . Menorrhagia     Past Surgical History  Procedure Laterality Date  . Tonsillectomy      Family History  Problem Relation Age of Onset  . Diabetes Other     History   Social History  . Marital Status: Single    Spouse Name: N/A    Number of Children: N/A  . 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 Smoking status: Never Smoker   . Smokeless tobacco: Never Used  . Alcohol Use: Yes     Comment: occasional  . Drug Use: No  . Sexual Activity: Not on file   Other Topics Concern  . Not on file   Social History Narrative   The PMH, PSH, Social History, Family History, Medications, and  allergies [facility], and have been updated if relevant.   Review of Systems  Constitutional: Negative.   HENT: Negative.   Eyes: Negative.   Respiratory: Negative.   Cardiovascular: Negative.   Gastrointestinal: Negative.   Genitourinary: Negative.   Musculoskeletal: Negative.   Neurological: Negative.   Hematological: Negative.   Psychiatric/Behavioral: Positive for sleep disturbance.  All other systems reviewed and are negative.      Objective:    BP 122/80 mmHg  Pulse 72  Temp(Src) 98.6 F (37 C) (Tympanic)  Ht 5\' 5"  (1.651 m)  Wt 264 lb (119.75 kg)  BMI 43.93 kg/m2 Wt Readings from Last 3 Encounters:  10/17/14 264 lb (119.75 kg)  10/02/14 260 lb (117.935 kg)  08/22/14 259 lb 8 oz (117.708 kg)     Physical Exam   General:  Obese,well-nourished,in no acute distress; alert,appropriate and cooperative throughout examination Head:  normocephalic and atraumatic.   Lungs:  Normal respiratory effort, chest expands symmetrically. Lungs are clear to auscultation, no crackles or wheezes. Heart:  Normal rate and regular rhythm. S1 and S2 normal without gallop, murmur, click, rub or other extra sounds. Abdomen:  Bowel sounds positive,abdomen soft and non-tender without masses, organomegaly or hernias noted. Msk:  No deformity or  scoliosis noted of thoracic or lumbar spine.   Extremities:  No clubbing, cyanosis, edema, or deformity noted with normal full range of motion of all joints.   Neurologic:  alert & oriented X3 and gait normal.   Skin:  Intact without suspicious lesions or rashes Cervical Nodes:  No lymphadenopathy noted Axillary Nodes:  No palpable lymphadenopathy Psych:  Cognition and judgment appear intact. Alert and cooperative with normal attention span and concentration. No apparent delusions, illusions, hallucinations       Assessment & Plan:   Well woman exam with routine gynecological exam  Severe obesity (BMI >= 40)  Insomnia No  Follow-up on file.

## 2014-10-17 NOTE — Patient Instructions (Signed)
Great to see you. We will call you with your lab results and you can view them online.  We are starting Trazodone 25-50 mg nightly.  Try My [employer]itness Pal.  We will talk about our weight loss after we get your lab results.

## 2014-10-22 ENCOUNTER — Ambulatory Visit (INDEPENDENT_AMBULATORY_CARE_PROVIDER_SITE_OTHER): Payer: 59 | Admitting: Family Medicine

## 2014-10-22 ENCOUNTER — Encounter: Payer: Self-pay | Admitting: Family Medicine

## 2014-10-22 DIAGNOSIS — G4700 Insomnia, unspecified: Secondary | ICD-10-CM

## 2014-10-22 MED ORDER — PHENTERMINE HCL 15 MG PO CAPS: 15.0000 mg | ORAL_CAPSULE | ORAL | Status: DC

## 2014-10-22 NOTE — Assessment & Plan Note (Signed)
Improved with trazadone. Continue current rx.

## 2014-10-22 NOTE — Patient Instructions (Signed)
Great to see you. I am so happy the Trazodone is working!  We are starting phentermine 15 mg daily- take in the morning. Come see me in 1 month.

## 2014-10-22 NOTE — Progress Notes (Signed)
Subjective:   Patient ID: Brenda Ewing, female    DOB: 02/07/1981, 33 y.o.   MRN: [id number]  Brenda Ewing is a pleasant 33 y.o. year old female who presents to clinic today with Weight Loss  on 10/22/2014  HPI:   Obesity- has been an issue for years.  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  Feels she gains more weight as she continues to exercise. Wt Readings from Last 3 Encounters:  10/22/14 262 lb 8 oz (119.069 kg)  10/17/14 264 lb (119.75 kg)  10/02/14 260 lb (117.935 kg)   Lab work unremarkable. Lab Results  Component Value Date   TSH 2.96 10/17/2014   Lab Results  Component Value Date   NA 137 10/17/2014   K 4.0 10/17/2014   CL 105 10/17/2014   CO2 22 10/17/2014     Insomnia- has had issues falling and staying asleep since childhood, even when she was much thinner. Benadryl and melatonin in effective.  Started trazadone last week and she has already noticed significant improvement.   Current Outpatient Prescriptions on File Prior to Visit  Medication Sig Dispense Refill  . Melatonin 5 MG CAPS Take by mouth daily.    . traZODone (DESYREL) 50 MG tablet Take 0.5-1 tablets (25-50 mg total) by mouth at bedtime as needed for sleep. 30 tablet 3   No current facility-administered medications on file prior to visit.    No Known Allergies  Past Medical History  Diagnosis Date  . Menorrhagia     Past Surgical History  Procedure Laterality Date  . Tonsillectomy      Family History  Problem Relation Age of Onset  . Diabetes Other     History   Social History  . Marital Status: Single    Spouse Name: N/A    Number of Children: N/A  . 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 Smoking status: Never Smoker   . Smokeless tobacco: Never Used  . Alcohol Use: Yes     Comment: occasional  . Drug Use: No  . Sexual Activity: Not on  file   Other Topics Concern  . Not on file   Social History Narrative   The PMH, PSH, Social History, Family History, Medications, and allergies [facility], and have been updated if relevant.   Review of Systems  Constitutional: Negative.   HENT: Negative.   Eyes: Negative.   Respiratory: Negative.   Cardiovascular: Negative.   Gastrointestinal: Negative.   Genitourinary: Negative.   Musculoskeletal: Negative.   Neurological: Negative.   Hematological: Negative.   Psychiatric/Behavioral: Positive for sleep disturbance.  All other systems reviewed and are negative.      Objective:    BP 120/78 mmHg  Pulse 98  Temp(Src) 98.5 F (36.9 C) (Oral)  Ht 5\' 5"  (1.651 m)  Wt 262 lb 8 oz (119.069 kg)  BMI 43.68 kg/m2  SpO2 97%  LMP 09/25/2014 Wt Readings from Last 3 Encounters:  10/22/14 262 lb 8 oz (119.069 kg)  10/17/14 264 lb (119.75 kg)  10/02/14 260 lb (117.935 kg)     Physical Exam   General:  Obese,well-nourished,in no acute distress; alert,appropriate and cooperative throughout examination Head:  normocephalic and atraumatic.   Lungs:  Normal respiratory effort, chest expands symmetrically. Lungs are clear to auscultation, no crackles or wheezes. Heart:  Normal rate and regular rhythm. S1 and S2 normal without gallop, murmur, click, rub or other extra sounds. Abdomen:  Bowel sounds positive,abdomen soft and non-tender without masses, organomegaly or hernias noted. Msk:  No deformity or scoliosis noted of thoracic or lumbar spine.   Extremities:  No clubbing, cyanosis, edema, or deformity noted with normal full range of motion of all joints.   Neurologic:  alert & oriented X3 and gait normal.   Skin:  Intact without suspicious lesions or rashes Cervical Nodes:  No lymphadenopathy noted Axillary Nodes:  No palpable lymphadenopathy Psych:  Cognition and judgment appear intact. Alert and cooperative with normal attention span and concentration. No  apparent delusions, illusions, hallucinations       Assessment & Plan:   Severe obesity (BMI >= 40)  Insomnia No Follow-up on file.

## 2014-10-22 NOTE — Progress Notes (Signed)
Pre visit review using our clinic review tool, if applicable. No additional management support is needed unless otherwise documented below in the visit note. 

## 2014-10-22 NOTE — Assessment & Plan Note (Signed)
Discussed weight loss plan.  Pt would also like to discuss medication options- discussed phentermine risk benefits, side effects including HTN, pulmonary HTN, stroke.    She would like to start phentermine and lifestyle changes. She is not sexually active- aware that phentermine is not considered safe to take during pregnancy.  Follow up in 1 month.  If BMI < 27 will decrease to half dose x 1 month then stop

## 2014-11-19 ENCOUNTER — Ambulatory Visit: Payer: 59 | Admitting: Family Medicine

## 2015-01-25 ENCOUNTER — Ambulatory Visit (INDEPENDENT_AMBULATORY_CARE_PROVIDER_SITE_OTHER): Payer: 59 | Admitting: Internal Medicine

## 2015-01-25 ENCOUNTER — Encounter: Payer: Self-pay | Admitting: Internal Medicine

## 2015-01-25 VITALS — BP 124/82 | HR 84 | Temp 98.0°F | Wt 249.0 lb

## 2015-01-25 DIAGNOSIS — J069 Acute upper respiratory infection, unspecified: Secondary | ICD-10-CM

## 2015-01-25 MED ORDER — HYDROCODONE-HOMATROPINE 5-1.5 MG/5ML PO SYRP: 5.0000 mL | ORAL_SOLUTION | Freq: Three times a day (TID) | ORAL | Status: DC | PRN

## 2015-01-25 MED ORDER — AZITHROMYCIN 250 MG PO TABS: ORAL_TABLET | ORAL | Status: DC

## 2015-01-25 MED ORDER — HYDROCOD POLST-CHLORPHEN POLST 10-8 MG/5ML PO LQCR: 5.0000 mL | Freq: Two times a day (BID) | ORAL | Status: DC | PRN

## 2015-01-25 NOTE — Progress Notes (Signed)
HPI  Pt presents to the clinic today with c/o headache, fatigue, sore throat, fever, cough and chest congestion. She reports this started 2 weeks ago. The cough is nonproductive. The cough is worse at night. She has run a fever up to 103. She is blowing clear mucous out of her nose. She has tried Mucinex cough and cold without any relief. She has no history of allergies or breathing problems. She has had sick contacts. She does not smoke.  Review of Systems    Past Medical History  Diagnosis Date  . Menorrhagia     Family History  Problem Relation Age of Onset  . Diabetes Other     History   Social History  . Marital Status: Single    Spouse Name: N/A  . Number of Children: N/A  . 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 Smoking status: Never Smoker   . Smokeless tobacco: Never Used  . Alcohol Use: Yes     Comment: occasional  . Drug Use: No  . Sexual Activity: Not on file   Other Topics Concern  . Not on file   Social History Narrative    No Known Allergies   Constitutional: Positive headache, fatigue and fever. Denies abrupt weight changes.  HEENT:  Positive sore throat. Denies eye redness, ear pain, ringing in the ears, wax buildup, runny nose or bloody nose. Respiratory: Positive cough. Denies difficulty breathing or shortness of breath.  Cardiovascular: Denies chest pain, chest tightness, palpitations or swelling in the hands or feet.   No other specific complaints in a complete review of systems (except as listed in HPI above).  Objective:  BP 124/82 mmHg  Pulse 84  Temp(Src) 98 F (36.7 C) (Oral)  Wt 249 lb (112.946 kg)  SpO2 98%  LMP 01/19/2015   General: Appears her stated age, ill appearing in NAD. HEENT: Head: normal shape and size, no sinus tenderness noted; Eyes: sclera white, no icterus, conjunctiva pink; Ears: Tm's red but intact, normal light reflex; Nose: mucosa pink and moist,  septum midline; Throat/Mouth: Teeth present, mucosa erythematous and moist, no exudate noted, no lesions or ulcerations noted.  Neck: No adenopathy noted. Cardiovascular: Normal rate and rhythm. S1,S2 noted.  No murmur, rubs or gallops noted. Pulmonary/Chest: Normal effort and positive vesicular breath sounds. No respiratory distress. No wheezes, rales or ronchi noted.      Assessment & Plan:   Upper respiratory infection  Gest some rest and drink plenty of fluid Ibuprofen and salt walter gargles for the throat Given duration of symptoms, eRx for Azithromax x 5 days RX for Tussionex cough syrup  RTC as needed or if symptoms persist.

## 2015-01-25 NOTE — Progress Notes (Signed)
   Subjective:    Patient ID: Brenda Ewing, female    DOB: 06/27/1981, 34 y.o.   MRN: [id number]  HPI Brenda Ewing is a 34 year old female who presents today with chief complaint of sore throat, cough and congestion.  Symptoms began 2 weeks and has not gotten any better.  She has taken OTC Mucinex and Robitussin without relief.  She has had a fever as high as 103, 2 days ago.  Fever free today.  The feels that the worse part of her symptoms are her cough and not being able to sleep at night due to the cough.    Review of Systems  Constitutional: Positive for fever and fatigue. Negative for chills.  HENT: Positive for congestion, rhinorrhea, sinus pressure and sore throat.   Respiratory: Positive for cough. Negative for shortness of breath and wheezing.   Cardiovascular: Negative for chest pain, palpitations and leg swelling.  Skin: Negative for color change, pallor, rash and wound.  Neurological: Negative for dizziness, tremors and headaches.       Objective:   Physical Exam  Constitutional: She is oriented to person, place, and time. She appears well-developed and well-nourished.  HENT:  Head: Normocephalic and atraumatic.  Right Ear: External ear normal.  Left Ear: External ear normal.  Cardiovascular: Normal rate, regular rhythm and normal heart sounds.   Pulmonary/Chest: Effort normal and breath sounds normal. She has no wheezes.  Musculoskeletal: Normal range of motion.  Lymphadenopathy:    She has no cervical adenopathy.  Neurological: She is alert and oriented to person, place, and time.  Skin: Skin is warm.  Psychiatric: She has a normal mood and affect.   Past Medical History  Diagnosis Date  . Menorrhagia    Family History  Problem Relation Age of Onset  . Diabetes Other    Current Outpatient Prescriptions on File Prior to Visit  Medication Sig Dispense Refill  . phentermine 15 MG capsule Take 1 capsule (15 mg total) by mouth every morning. 30 capsule 0  .  traZODone (DESYREL) 50 MG tablet Take 0.5-1 tablets (25-50 mg total) by mouth at bedtime as needed for sleep. 30 tablet 3   No current facility-administered medications on file prior to visit.          Assessment & Plan:  1. Upper respiratory infection - Will Rx for azithromycin and hycodan cough syrup at night.  Follow up with office if symptoms persist.

## 2015-01-25 NOTE — Progress Notes (Signed)
Pre visit review using our clinic review tool, if applicable. No additional management support is needed unless otherwise documented below in the visit note. 

## 2015-01-25 NOTE — Patient Instructions (Signed)
Upper Respiratory Infection, Adult An upper respiratory infection (URI) is also sometimes known as the common cold. The upper respiratory tract includes the nose, sinuses, throat, trachea, and bronchi. Bronchi are the airways leading to the lungs. Most people improve within 1 week, but symptoms can last up to 2 weeks. A residual cough may last even longer.  CAUSES Many different viruses can infect the tissues lining the upper respiratory tract. The tissues become irritated and inflamed and often become very moist. Mucus production is also common. A cold is contagious. You can easily spread the virus to others by oral contact. This includes kissing, sharing a glass, coughing, or sneezing. Touching your mouth or nose and then touching a surface, which is then touched by another person, can also spread the virus. SYMPTOMS  Symptoms typically develop 1 to 3 days after you come in contact with a cold virus. Symptoms vary from person to person. They may include:  Runny nose.  Sneezing.  Nasal congestion.  Sinus irritation.  Sore throat.  Loss of voice (laryngitis).  Cough.  Fatigue.  Muscle aches.  Loss of appetite.  Headache.  Low-grade fever. DIAGNOSIS  You might diagnose your own cold based on familiar symptoms, since most people get a cold 2 to 3 times a year. Your caregiver can confirm this based on your exam. Most importantly, your caregiver can check that your symptoms are not due to another disease such as strep throat, sinusitis, pneumonia, asthma, or epiglottitis. Blood tests, throat tests, and X-rays are not necessary to diagnose a common cold, but they may sometimes be helpful in excluding other more serious diseases. Your caregiver will decide if any further tests are required. RISKS AND COMPLICATIONS  You may be at risk for a more severe case of the common cold if you smoke cigarettes, have chronic heart disease (such as heart failure) or lung disease (such as asthma), or if  you have a weakened immune system. The very young and very old are also at risk for more serious infections. Bacterial sinusitis, middle ear infections, and bacterial pneumonia can complicate the common cold. The common cold can worsen asthma and chronic obstructive pulmonary disease (COPD). Sometimes, these complications can require emergency medical care and may be life-threatening. PREVENTION  The best way to protect against getting a cold is to practice good hygiene. Avoid oral or hand contact with people with cold symptoms. Wash your hands often if contact occurs. There is no clear evidence that vitamin C, vitamin E, echinacea, or exercise reduces the chance of developing a cold. However, it is always recommended to get plenty of rest and practice good nutrition. TREATMENT  Treatment is directed at relieving symptoms. There is no cure. Antibiotics are not effective, because the infection is caused by a virus, not by bacteria. Treatment may include:  Increased fluid intake. Sports drinks offer valuable electrolytes, sugars, and fluids.  Breathing heated mist or steam (vaporizer or shower).  Eating chicken soup or other clear broths, and maintaining good nutrition.  Getting plenty of rest.  Using gargles or lozenges for comfort.  Controlling fevers with ibuprofen or acetaminophen as directed by your caregiver.  Increasing usage of your inhaler if you have asthma. Zinc gel and zinc lozenges, taken in the first 24 hours of the common cold, can shorten the duration and lessen the severity of symptoms. Pain medicines may help with fever, muscle aches, and throat pain. A variety of non-prescription medicines are available to treat congestion and runny nose. Your caregiver   can make recommendations and may suggest nasal or lung inhalers for other symptoms.  HOME CARE INSTRUCTIONS   Only take over-the-counter or prescription medicines for pain, discomfort, or fever as directed by your  caregiver.  Use a warm mist humidifier or inhale steam from a shower to increase air moisture. This may keep secretions moist and make it easier to breathe.  Drink enough water and fluids to keep your urine clear or pale yellow.  Rest as needed.  Return to work when your temperature has returned to normal or as your caregiver advises. You may need to stay home longer to avoid infecting others. You can also use a face mask and careful hand washing to prevent spread of the virus. SEEK MEDICAL CARE IF:   After the first few days, you feel you are getting worse rather than better.  You need your caregiver's advice about medicines to control symptoms.  You develop chills, worsening shortness of breath, or brown or red sputum. These may be signs of pneumonia.  You develop yellow or brown nasal discharge or pain in the face, especially when you bend forward. These may be signs of sinusitis.  You develop a fever, swollen neck glands, pain with swallowing, or white areas in the back of your throat. These may be signs of strep throat. SEEK IMMEDIATE MEDICAL CARE IF:   You have a fever.  You develop severe or persistent headache, ear pain, sinus pain, or chest pain.  You develop wheezing, a prolonged cough, cough up blood, or have a change in your usual mucus (if you have chronic lung disease).  You develop sore muscles or a stiff neck. Document Released: 05/19/2001 Document Revised: 02/15/2012 Document Reviewed: 02/28/2014 Exit[employer] Patient Information 2015 [employer]. This information is not intended to replace advice given to you by your health care provider. Make sure you discuss any questions you have with your health care provider.  

## 2015-02-18 ENCOUNTER — Encounter: Payer: Self-pay | Admitting: Family Medicine

## 2015-02-18 ENCOUNTER — Ambulatory Visit (INDEPENDENT_AMBULATORY_CARE_PROVIDER_SITE_OTHER): Payer: 59 | Admitting: Family Medicine

## 2015-02-18 DIAGNOSIS — G4700 Insomnia, unspecified: Secondary | ICD-10-CM

## 2015-02-18 MED ORDER — PHENTERMINE HCL 30 MG PO CAPS: 30.0000 mg | ORAL_CAPSULE | ORAL | Status: DC

## 2015-02-18 NOTE — Progress Notes (Signed)
Subjective:   Patient ID: Brenda Ewing, female    DOB: 12/13/1980, 34 y.o.   MRN: [id number]  Brenda Ewing is a pleasant 34 y.o. year old female who presents to clinic today with Follow-up  on 02/18/2015  HPI:   Obesity- has been an issue for years.Exercising regularly.  Felt phentermine 15 mg daily helped.  She would like to try a higher dose.  Denies any CP, SOB or palpitations.. Wt Readings from Last 3 Encounters:  02/18/15 251 lb 8 oz (114.08 kg)  01/25/15 249 lb (112.946 kg)  10/22/14 262 lb 8 oz (119.069 kg)   Lab work wnl.  Lab Results  Component Value Date   TSH 2.96 10/17/2014   Lab Results  Component Value Date   NA 137 10/17/2014   K 4.0 10/17/2014   CL 105 10/17/2014   CO2 22 10/17/2014     Insomnia- has had issues falling and staying asleep since childhood, even when she was much thinner. Trazodone has helped "significantly."  She says she "has never slept this well."  Current Outpatient Prescriptions on File Prior to Visit  Medication Sig Dispense Refill  . phentermine 15 MG capsule Take 1 capsule (15 mg total) by mouth every morning. 30 capsule 0  . traZODone (DESYREL) 50 MG tablet Take 0.5-1 tablets (25-50 mg total) by mouth at bedtime as needed for sleep. 30 tablet 3   No current facility-administered medications on file prior to visit.    No Known Allergies  Past Medical History  Diagnosis Date  . Menorrhagia     Past Surgical History  Procedure Laterality Date  . Tonsillectomy      Family History  Problem Relation Age of Onset  . Diabetes Other     History   Social History  . Marital Status: Single    Spouse Name: N/A  . Number of Children: N/A  . 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 Smoking status: Never Smoker   . Smokeless tobacco: Never Used  . Alcohol Use: Yes     Comment: occasional  . Drug Use: No  . Sexual Activity: Not on file   Other Topics  Concern  . Not on file   Social History Narrative   The PMH, PSH, Social History, Family History, Medications, and allergies [facility], and have been updated if relevant.   Review of Systems  Constitutional: Negative.   HENT: Negative.   Eyes: Negative.   Respiratory: Negative.   Cardiovascular: Negative.   Gastrointestinal: Negative.   Genitourinary: Negative.   Musculoskeletal: Negative.   Neurological: Negative.   Hematological: Negative.   Psychiatric/Behavioral: Negative for sleep disturbance.  All other systems reviewed and are negative.      Objective:    BP 114/70 mmHg  Pulse 73  Temp(Src) 98 F (36.7 C) (Oral)  Wt 251 lb 8 oz (114.08 kg)  SpO2 97%  LMP 01/16/2015 Wt Readings from Last 3 Encounters:  02/18/15 251 lb 8 oz (114.08 kg)  01/25/15 249 lb (112.946 kg)  10/22/14 262 lb 8 oz (119.069 kg)     Physical Exam   General:  Obese,well-nourished,in no acute distress; alert,appropriate and cooperative throughout examination Head:  normocephalic and atraumatic.   Lungs:  Normal respiratory effort, chest expands symmetrically. Lungs are clear to auscultation, no crackles or wheezes. Heart:  Normal rate and regular rhythm. S1 and S2 normal without  gallop, murmur, click, rub or other extra sounds. Abdomen:  Bowel sounds positive,abdomen soft and non-tender without masses, organomegaly or hernias noted. Msk:  No deformity or scoliosis noted of thoracic or lumbar spine.   Extremities:  No clubbing, cyanosis, edema, or deformity noted with normal full range of motion of all joints.   Neurologic:  alert & oriented X3 and gait normal.   Skin:  Intact without suspicious lesions or rashes Cervical Nodes:  No lymphadenopathy noted Axillary Nodes:  No palpable lymphadenopathy Psych:  Cognition and judgment appear intact. Alert and cooperative with normal attention span and concentration. No apparent delusions, illusions, hallucinations         Assessment & Plan:   No diagnosis found. No Follow-up on file.

## 2015-02-18 NOTE — Assessment & Plan Note (Signed)
Overall improving, did gain a few pounds recently. She is aware of side effects and potential risks of phentermine.  She would like to increase dose. rx given to pt for phentermine 30 mg daily.

## 2015-02-18 NOTE — Patient Instructions (Signed)
Good to see you. Keep up the good work.  We are increasing your phentermine 30 mg daily.  Follow up in 3 months.

## 2015-02-18 NOTE — Assessment & Plan Note (Signed)
Improved with Trazodone at current dose. No changes made to rx today. She will call back with mail order pharmacy information so we can refill.

## 2015-02-18 NOTE — Progress Notes (Signed)
Pre visit review using our clinic review tool, if applicable. No additional management support is needed unless otherwise documented below in the visit note. 

## 2015-03-18 ENCOUNTER — Encounter: Payer: Self-pay | Admitting: Internal Medicine

## 2015-03-18 ENCOUNTER — Ambulatory Visit (INDEPENDENT_AMBULATORY_CARE_PROVIDER_SITE_OTHER): Payer: 59 | Admitting: Internal Medicine

## 2015-03-18 VITALS — BP 122/84 | HR 74 | Temp 97.8°F | Wt 249.0 lb

## 2015-03-18 DIAGNOSIS — J309 Allergic rhinitis, unspecified: Secondary | ICD-10-CM | POA: Diagnosis not present

## 2015-03-18 MED ORDER — FLUTICASONE PROPIONATE 50 MCG/ACT NA SUSP: 1.0000 | Freq: Every day | NASAL | Status: DC

## 2015-03-18 MED ORDER — HYDROCODONE-HOMATROPINE 5-1.5 MG/5ML PO SYRP: 5.0000 mL | ORAL_SOLUTION | Freq: Three times a day (TID) | ORAL | Status: DC | PRN

## 2015-03-18 MED ORDER — HYDROCOD POLST-CHLORPHEN POLST 10-8 MG/5ML PO LQCR: 5.0000 mL | Freq: Every evening | ORAL | Status: DC | PRN

## 2015-03-18 NOTE — Progress Notes (Signed)
HPI  Pt presents to the clinic today with c/p nasal congestion, cough and chest congestion. She reports this started 1 month ago but has gotten worse over the last week. The cough is dry and nonproductive. The cough is worse at night. She is blowing yellow, blood tinged mucous out of her nose. She was running fevers up to 102 1 week ago but that has since resolved. She was taking Claritin without relief but switched to Zyrtec. She has been taking Zyrtec for 3 weeks without relief. She has no history of allergies or breathing problems. She has not had sick contacts. She does not smoke.  Review of Systems    Past Medical History  Diagnosis Date  . Menorrhagia     Family History  Problem Relation Age of Onset  . Diabetes Other     History   Social History  . Marital Status: Single    Spouse Name: N/A  . Number of Children: N/A  . 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 Smoking status: Never Smoker   . Smokeless tobacco: Never Used  . Alcohol Use: Yes     Comment: occasional  . Drug Use: No  . Sexual Activity: Not on file   Other Topics Concern  . Not on file   Social History Narrative    No Known Allergies   Constitutional: Positive headache, fatigue and fever. Denies abrupt weight changes.  HEENT:  Positive nasal congestion. Denies eye redness, ear pain, ringing in the ears, wax buildup, runny nose or sore throat. Respiratory: Positive cough. Denies difficulty breathing or shortness of breath.  Cardiovascular: Denies chest pain, chest tightness, palpitations or swelling in the hands or feet.   No other specific complaints in a complete review of systems (except as listed in HPI above).  Objective:  BP 122/84 mmHg  Pulse 74  Temp(Src) 97.8 F (36.6 C) (Oral)  Wt 249 lb (112.946 kg)  SpO2 98%  LMP 03/17/2015   General: Appears her stated age, well developed, well nourished in NAD. HEENT: Head:  normal shape and size, no sinus tenderness noted; Eyes: sclera white, no icterus, conjunctiva pink; Ears: Tm's gray and intact, normal light reflex; Nose: mucosa boggy and moist, septum midline; Throat/Mouth: + PND. Teeth present, mucosa pink and moist, no exudate noted, no lesions or ulcerations noted.  Neck: No adenopathy noted. Cardiovascular: Normal rate and rhythm. S1,S2 noted.  No murmur, rubs or gallops noted.  Pulmonary/Chest: Normal effort and positive vesicular breath sounds. No respiratory distress. No wheezes, rales or ronchi noted.      Assessment & Plan:   Allergic Rhintis  Can use a Neti Pot which can be purchased from your local drug store. Flonase 2 sprays each nostril for 3 days and then as needed. Continue Zyrtec at night RX for Tussionex cough syrup Watch for recurrent fever, facial pain or pressure, and darker colored nasal drainage  RTC as needed or if symptoms persist.

## 2015-03-18 NOTE — Patient Instructions (Signed)

## 2015-03-18 NOTE — Progress Notes (Signed)
Pre visit review using our clinic review tool, if applicable. No additional management support is needed unless otherwise documented below in the visit note. 

## 2015-07-02 ENCOUNTER — Ambulatory Visit (INDEPENDENT_AMBULATORY_CARE_PROVIDER_SITE_OTHER): Payer: 59 | Admitting: Podiatry

## 2015-07-02 ENCOUNTER — Ambulatory Visit (INDEPENDENT_AMBULATORY_CARE_PROVIDER_SITE_OTHER): Payer: 59

## 2015-07-02 ENCOUNTER — Encounter: Payer: Self-pay | Admitting: Podiatry

## 2015-07-02 VITALS — BP 119/82 | HR 104 | Resp 20

## 2015-07-02 DIAGNOSIS — M2142 Flat foot [pes planus] (acquired), left foot: Secondary | ICD-10-CM | POA: Diagnosis not present

## 2015-07-02 DIAGNOSIS — G5750 Tarsal tunnel syndrome, unspecified lower limb: Secondary | ICD-10-CM

## 2015-07-02 DIAGNOSIS — Q6650 Congenital pes planus, unspecified foot: Secondary | ICD-10-CM

## 2015-07-02 DIAGNOSIS — M2141 Flat foot [pes planus] (acquired), right foot: Secondary | ICD-10-CM

## 2015-07-02 DIAGNOSIS — M25579 Pain in unspecified ankle and joints of unspecified foot: Secondary | ICD-10-CM

## 2015-07-02 NOTE — Progress Notes (Signed)
   Subjective:    Patient ID: Brenda Ewing, female    DOB: 1981/06/23, 34 y.o.   MRN: [id number]  HPI 34 year old renal presents the office today with concerns of her feet turning and works. She states that she's had pain to her feet for several years and she's had no significant treatment. She has tried multiple over-the-counter inserts and shoe gear changes without any relief. She states that she does have some pain to her feet after standing or walking for long period time. She denies any numbness or dealing. She denies any swelling or redness. She's had no recent injury or trauma. No other complaints at this time.  Review of Systems  All other systems reviewed and are negative.      Objective:   Physical Exam AAO x3, NAD DP/PT pulses palpable bilaterally, CRT less than 3 seconds Protective sensation intact with Simms Weinstein monofilament, vibratory sensation intact, Achilles tendon reflex intact Nonweightbearing exam reveals that the ankle, subtalar, midtarsal, MTP joints are intact. There is mild discomfort with subtalar joint range of motion. There is mild tenderness to palpation along the lateral aspect of the foot over the sinus tarsi bilaterally. Equinus is present. No other areas of tenderness to bilateral lower extremities. MMT 5/5, ROM WNL. Weightbearing exam reveals a significant decrease in medial arch height. Forefoot abduction is present as well as calcaneal valgus. Gait evaluation reveals excessive pronation. No open lesions or pre-ulcerative lesions.  No overlying edema, erythema, increase in warmth to bilateral lower extremities.  No pain with calf compression, swelling, warmth, erythema bilaterally.      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 -X-rays were obtained and reviewed with the patient.  -Treatment options discussed including all alternatives, risks, and complications -As she is attended multiple  over-the-counter inserts without any relief I do believe that should be a good candidate for custom orthotics. She'll let to proceed with this at this time. She was scanned for orthotics and they were sent to [facility] labs. -Follow-up 3 weeks to pick up orthotics or sooner if any problems arise. In the meantime, encouraged to call the office with any questions, concerns, change in symptoms.   Ovid Curd, DPM

## 2015-07-23 ENCOUNTER — Encounter: Payer: Self-pay | Admitting: Podiatry

## 2015-07-23 ENCOUNTER — Ambulatory Visit (INDEPENDENT_AMBULATORY_CARE_PROVIDER_SITE_OTHER): Payer: 59 | Admitting: Podiatry

## 2015-07-23 VITALS — BP 114/79 | HR 99 | Resp 18

## 2015-07-23 DIAGNOSIS — M25579 Pain in unspecified ankle and joints of unspecified foot: Secondary | ICD-10-CM

## 2015-07-23 DIAGNOSIS — Q6650 Congenital pes planus, unspecified foot: Secondary | ICD-10-CM

## 2015-07-23 DIAGNOSIS — G5750 Tarsal tunnel syndrome, unspecified lower limb: Secondary | ICD-10-CM

## 2015-07-23 NOTE — Progress Notes (Signed)
Patient ID: Leoni C [place], female   DOB: 09/26/1981, 34 y.o.   MRN: [id number]  Patient presents to the office today to pick up orthotics. Orthotics were dispensed and they were cut to fit into her shoes. She states that she has symptoms in his previous without any change. She denies any acute changes since last appointment and no other complaints at this time. Written and oral break in instructions were given to the patient for the orthotics. I discussed this any problems to call the office immediately. Otherwise I will see her back in 4 weeks.  Ovid Curd, DPM

## 2015-07-23 NOTE — Patient Instructions (Signed)

## 2015-08-20 ENCOUNTER — Ambulatory Visit: Payer: 59 | Admitting: Podiatry

## 2015-10-07 ENCOUNTER — Encounter: Payer: Self-pay | Admitting: Family Medicine

## 2015-10-07 ENCOUNTER — Ambulatory Visit (INDEPENDENT_AMBULATORY_CARE_PROVIDER_SITE_OTHER): Payer: 59 | Admitting: Family Medicine

## 2015-10-07 DIAGNOSIS — M6740 Ganglion, unspecified site: Secondary | ICD-10-CM | POA: Diagnosis not present

## 2015-10-07 MED ORDER — PHENTERMINE HCL 30 MG PO CAPS: 30.0000 mg | ORAL_CAPSULE | ORAL | Status: DC

## 2015-10-07 NOTE — Progress Notes (Signed)
   Subjective:   Patient ID: Brenda Ewing [place], female    DOB: 04/29/1981, 34 y.o.   MRN: [id number]  Brenda Ewing [place] is a pleasant 34 y.o. year old female who presents to clinic today with Cyst  on 10/07/2015  HPI: Cyst on right wrist- noticed it start growing 6 months ago.  Now larger and making her hand hurt with certain movements.  No redness or warmth.  Has not tried anything for it.  Obesity- did well with phentermine.  She would like to restart it. Did not have any palpitations, HA, blurred vision, CP or SOB. Wt Readings from Last 3 Encounters:  10/07/15 255 lb 8 oz (115.894 kg)  03/18/15 249 lb (112.946 kg)  02/18/15 251 lb 8 oz (114.08 kg)   Current Outpatient Prescriptions on File Prior to Visit  Medication Sig Dispense Refill  . fluticasone (FLONASE) 50 MCG/ACT nasal spray Place 1 spray into both nostrils daily. 16 g 2   No current facility-administered medications on file prior to visit.    No Known Allergies  Past Medical History  Diagnosis Date  . Menorrhagia     Past Surgical History  Procedure Laterality Date  . Tonsillectomy      Family History  Problem Relation Age of Onset  . Diabetes Other     Social History   Social History  . Marital Status: Single    Spouse Name: N/A  . Number of Children: N/A  . 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 Smoking status: Never Smoker   . Smokeless tobacco: Never Used  . Alcohol Use: Yes     Comment: occasional  . Drug Use: No  . Sexual Activity: Not on file   Other Topics Concern  . Not on file   Social History Narrative   The PMH, PSH, Social History, Family History, Medications, and allergies have [facility], and have been updated if relevant.   Review of Systems  Constitutional: Negative.   HENT: Negative.   Respiratory: Negative.   Cardiovascular: Negative.   Gastrointestinal: Negative.   Genitourinary: Negative.     Musculoskeletal: Negative.   Neurological: Negative.   Hematological: Negative.   Psychiatric/Behavioral: Negative.   All other systems reviewed and are negative.      Objective:    BP 114/74 mmHg  Pulse 97  Temp(Src) 98.3 F (36.8 Ewing) (Oral)  Wt 255 lb 8 oz (115.894 kg)  SpO2 96%  LMP 09/05/2015   Physical Exam  Constitutional: She is oriented to person, place, and time. She appears well-developed and well-nourished. No distress.  HENT:  Head: Normocephalic.  Eyes: Conjunctivae are normal.  Cardiovascular: Normal rate.   Pulmonary/Chest: Effort normal.  Musculoskeletal:       Arms: Neurological: She is alert and oriented to person, place, and time. No cranial nerve deficit.  Skin: Skin is warm and dry.  Psychiatric: She has a normal mood and affect. Her behavior is normal. Judgment and thought content normal.  Nursing note and vitals reviewed.         Assessment & Plan:   Morbid obesity, unspecified obesity type (HCC)  Ganglion cyst No Follow-up on file.

## 2015-10-07 NOTE — Assessment & Plan Note (Signed)
Deteriorated. Discussed weight loss plan. She would like to restart phenetermine- discussed phentermine risk benefits again, side effects including HTN, pulmonary HTN, stroke.    She would like to start phentermine and lifestyle changes.  Follow up in 3 months.  If BMI < 27 will decrease to half dose x 1 month then stop

## 2015-10-07 NOTE — Assessment & Plan Note (Signed)
New and becoming larger and symptomatic. Refer to ortho for removal. The patient indicates understanding of these issues and agrees with the plan.

## 2015-10-07 NOTE — Patient Instructions (Signed)
Ganglion Cyst  A ganglion cyst is a noncancerous, fluid-filled lump that occurs near joints or tendons. The ganglion cyst grows out of a joint or the lining of a tendon. It most often develops in the hand or wrist, but it can also develop in the shoulder, elbow, hip, knee, ankle, or foot. The round or oval ganglion cyst can be the size of a pea or larger than a grape. Increased activity may enlarge the size of the cyst because more fluid starts to build up.   CAUSES  It is not known what causes a ganglion cyst to grow. However, it may be related to:  · Inflammation or irritation around the joint.  · An injury.  · Repetitive movements or overuse.  · Arthritis.  RISK FACTORS  Risk factors include:  · Being a woman.  · Being age 20-50.  SIGNS AND SYMPTOMS  Symptoms may include:   · A lump. This most often appears on the hand or wrist, but it can occur in other areas of the body.  · Tingling.  · Pain.  · Numbness.  · Muscle weakness.  · Weak grip.  · Less movement in a joint.  DIAGNOSIS  Ganglion cysts are most often diagnosed based on a physical exam. Your health care provider will feel the lump and may shine a light alongside it. If it is a ganglion cyst, a light often shines through it. Your health care provider may order an X-ray, ultrasound, or MRI to rule out other conditions.  TREATMENT  Ganglion cysts usually go away on their own without treatment. If pain or other symptoms are involved, treatment may be needed. Treatment is also needed if the ganglion cyst limits your movement or if it gets infected. Treatment may include:  · Wearing a brace or splint on your wrist or finger.  · Taking anti-inflammatory medicine.  · Draining fluid from the lump with a needle (aspiration).  · Injecting a steroid into the joint.  · Surgery to remove the ganglion cyst.  HOME CARE INSTRUCTIONS  · Do not press on the ganglion cyst, poke it with a needle, or hit it.  · Take medicines only as directed by your health care  provider.  · Wear your brace or splint as directed by your health care provider.  · Watch your ganglion cyst for any changes.  · Keep all follow-up visits as directed by your health care provider. This is important.  SEEK MEDICAL CARE IF:  · Your ganglion cyst becomes larger or more painful.  · You have increased redness, red streaks, or swelling.  · You have pus coming from the lump.  · You have weakness or numbness in the affected area.  · You have a fever or chills.     This information is not intended to replace advice given to you by your health care provider. Make sure you discuss any questions you have with your health care provider.     Document Released: 11/20/2000 Document Revised: 12/14/2014 Document Reviewed: 05/08/2014  Elsevier Interactive Patient Education ©2016 Elsevier Inc.

## 2015-10-07 NOTE — Progress Notes (Signed)
Pre visit review using our clinic review tool, if applicable. No additional management support is needed unless otherwise documented below in the visit note. 

## 2015-12-03 ENCOUNTER — Telehealth: Payer: Self-pay

## 2015-12-03 NOTE — Telephone Encounter (Signed)
Pt has had prod cough with yellow phlegm for approx 6 days, fever for 3 days and earache; pt request tussionex; advised pt needed to be seen pt scheduled appt 12/04/15 at 8:30 with Dr Reece AgarG. If pt condition changes or worsens prior to appt pt will go to UC. Pt voiced understanding.

## 2015-12-04 ENCOUNTER — Ambulatory Visit (INDEPENDENT_AMBULATORY_CARE_PROVIDER_SITE_OTHER): Payer: 59 | Admitting: Family Medicine

## 2015-12-04 ENCOUNTER — Encounter: Payer: Self-pay | Admitting: Family Medicine

## 2015-12-04 VITALS — BP 126/74 | HR 98 | Temp 98.2°F | Wt 252.0 lb

## 2015-12-04 DIAGNOSIS — J019 Acute sinusitis, unspecified: Secondary | ICD-10-CM | POA: Diagnosis not present

## 2015-12-04 DIAGNOSIS — B9689 Other specified bacterial agents as the cause of diseases classified elsewhere: Secondary | ICD-10-CM

## 2015-12-04 MED ORDER — GUAIFENESIN-CODEINE 100-10 MG/5ML PO SYRP: 5.0000 mL | ORAL_SOLUTION | Freq: Two times a day (BID) | ORAL | Status: DC | PRN

## 2015-12-04 MED ORDER — AMOXICILLIN-POT CLAVULANATE 875-125 MG PO TABS: 1.0000 | ORAL_TABLET | Freq: Two times a day (BID) | ORAL | Status: AC

## 2015-12-04 NOTE — Assessment & Plan Note (Signed)
Given progression of sxs and new onset fever >101 recommend treat aggressively as bacterial sinusitis with augmentin antibiotic. See pt instructions for further recs. Update if sxs don't improve.

## 2015-12-04 NOTE — Progress Notes (Signed)
BP 126/74 mmHg  Pulse 98  Temp(Src) 98.2 F (36.8 C) (Oral)  Wt 252 lb (114.306 kg)  SpO2 97%  LMP 11/10/2015   CC: cough  Subjective:    Patient ID: Brenda Ewing, female    DOB: 08/28/1981, 34 y.o.   MRN: [id number]  HPI: Brenda Ewing is a 34 y.o. female presenting on 12/04/2015 for URI   Pt of Dr Elmer SowAron's new to me with 1wk h/o productive cough and fever in PM (Tmax 101 last night). Cough with chest pain keeping her up at night time. Productive cough of yellow brown sputum. + ST, headache, sinus congestion, ++PNdrainage. Illness has progressively worsened.  No ear or tooth pain, dyspnea or wheezing, abd pain.  Has tried theraflu, tylenol cold/flu, mucinex. Took today off work. + sick contacts at work. No smokers at home. No h/o asthma.  On flonase for allergies.  Has not received flu shot.  Relevant past medical, surgical, family and social history reviewed and updated as indicated. Interim medical history since our last visit reviewed. Allergies and medications reviewed and updated. Current Outpatient Prescriptions on File Prior to Visit  Medication Sig  . phentermine 30 MG capsule Take 1 capsule (30 mg total) by mouth every morning.  . fluticasone (FLONASE) 50 MCG/ACT nasal spray Place 1 spray into both nostrils daily. (Patient not taking: Reported on 12/04/2015)   No current facility-administered medications on file prior to visit.    Review of Systems Per HPI unless specifically indicated in ROS section     Objective:    BP 126/74 mmHg  Pulse 98  Temp(Src) 98.2 F (36.8 C) (Oral)  Wt 252 lb (114.306 kg)  SpO2 97%  LMP 11/10/2015  Wt Readings from Last 3 Encounters:  12/04/15 252 lb (114.306 kg)  10/07/15 255 lb 8 oz (115.894 kg)  03/18/15 249 lb (112.946 kg)    Physical Exam  Constitutional: She appears well-developed and well-nourished. No distress.  HENT:  Head: Normocephalic and atraumatic.  Right Ear: Hearing, tympanic membrane, external  ear and ear canal normal.  Left Ear: Hearing, tympanic membrane, external ear and ear canal normal.  Nose: Mucosal edema present. No rhinorrhea. Right sinus exhibits no maxillary sinus tenderness and no frontal sinus tenderness. Left sinus exhibits no maxillary sinus tenderness and no frontal sinus tenderness.  Mouth/Throat: Uvula is midline, oropharynx is clear and moist and mucous membranes are normal. No oropharyngeal exudate, posterior oropharyngeal edema, posterior oropharyngeal erythema or tonsillar abscesses.  Erythematous inflamed nasal mucosa  Eyes: Conjunctivae and EOM are normal. Pupils are equal, round, and reactive to light. No scleral icterus.  Neck: Normal range of motion. Neck supple.  Cardiovascular: Normal rate, regular rhythm, normal heart sounds and intact distal pulses.   No murmur heard. Pulmonary/Chest: Effort normal and breath sounds normal. No respiratory distress. She has no wheezes. She has no rales.  Lungs clear  Lymphadenopathy:    She has no cervical adenopathy.  Skin: Skin is warm and dry. No rash noted.  Nursing note and vitals reviewed.     Assessment & Plan:   Problem List Items Addressed This Visit    Acute bacterial sinusitis - Primary    Given progression of sxs and new onset fever >101 recommend treat aggressively as bacterial sinusitis with augmentin antibiotic. See pt instructions for further recs. Update if sxs don't improve.      Relevant Medications   amoxicillin-clavulanate (AUGMENTIN) 875-125 MG tablet   guaiFENesin-codeine (CHERATUSSIN AC) 100-10 MG/5ML syrup  Follow up plan: Return if symptoms worsen or fail to improve.

## 2015-12-04 NOTE — Progress Notes (Signed)
Pre visit review using our clinic review tool, if applicable. No additional management support is needed unless otherwise documented below in the visit note. 

## 2015-12-04 NOTE — Addendum Note (Signed)
Addended by: Eustaquio BoydenGUTIERREZ, Marrianne Sica on: 12/04/2015 09:28 AM   Modules accepted: Kipp BroodSmartSet

## 2015-12-04 NOTE — Patient Instructions (Signed)
You have a sinus infection. Take medicine as prescribed: augmentin antibiotic, cheratussin cough syrup for night time. Push fluids and plenty of rest. May use ibuprofen 400-600mg  twice daily with food for sinus inflammation. May use plain mucinex with plenty of fluid to help mobilize mucous. Please let us know if fever >101.5, trouble opening/closing mouth, difficulty swallowing, or worsening instead of improving as expected.

## 2015-12-05 ENCOUNTER — Telehealth: Payer: Self-pay

## 2015-12-05 MED ORDER — BENZONATATE 200 MG PO CAPS: 200.0000 mg | ORAL_CAPSULE | Freq: Three times a day (TID) | ORAL | Status: DC | PRN

## 2015-12-05 NOTE — Telephone Encounter (Signed)
Left detailed message on voicemail.  

## 2015-12-05 NOTE — Telephone Encounter (Signed)
Pt left v/m; pt was seen on 12/04/15; pt got cheratussin and it was not effective for cough; pt did not get any sleep last night for coughing. Pt wants to know if can get different cough med. Pt request cb. Dr Reece AgarG out of office; sending note to Dr Para Marchuncan.

## 2015-12-05 NOTE — Telephone Encounter (Signed)
Can try tessalon.  Sent.  F/u prn.  Thanks.

## 2015-12-30 ENCOUNTER — Ambulatory Visit: Payer: 59 | Admitting: Family Medicine

## 2016-06-02 ENCOUNTER — Other Ambulatory Visit: Payer: Self-pay | Admitting: Family Medicine

## 2016-10-07 ENCOUNTER — Ambulatory Visit (INDEPENDENT_AMBULATORY_CARE_PROVIDER_SITE_OTHER): Payer: 59 | Admitting: Family Medicine

## 2016-10-07 ENCOUNTER — Encounter: Payer: Self-pay | Admitting: Family Medicine

## 2016-10-07 VITALS — BP 122/62 | HR 86 | Temp 97.8°F | Wt 261.0 lb

## 2016-10-07 DIAGNOSIS — J0110 Acute frontal sinusitis, unspecified: Secondary | ICD-10-CM | POA: Diagnosis not present

## 2016-10-07 MED ORDER — GUAIFENESIN-CODEINE 100-10 MG/5ML PO SYRP: 5.0000 mL | ORAL_SOLUTION | Freq: Two times a day (BID) | ORAL | 0 refills | Status: DC | PRN

## 2016-10-07 MED ORDER — AMOXICILLIN-POT CLAVULANATE 875-125 MG PO TABS
1.0000 | ORAL_TABLET | Freq: Two times a day (BID) | ORAL | 0 refills | Status: AC
Start: 2016-10-07 — End: 2016-10-21

## 2016-10-07 NOTE — Progress Notes (Signed)
SUBJECTIVE:  Brenda Ewing is a 35 y.o. female who complains of coryza, congestion, sneezing and bilateral sinus pain for 8 days. She denies a history of anorexia and chills and denies a history of asthma. Patient denies smoke cigarettes.   No current outpatient prescriptions on file prior to visit.   No current facility-administered medications on file prior to visit.     No Known Allergies  Past Medical History:  Diagnosis Date  . Menorrhagia     Past Surgical History:  Procedure Laterality Date  . TONSILLECTOMY      Family History  Problem Relation Age of Onset  . Diabetes Other     Social History   Social History  . Marital status: Single    Spouse name: N/A  . Number of children: N/A  . 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 Smoking status: Never Smoker  . Smokeless tobacco: Never Used  . Alcohol use Yes     Comment: occasional  . Drug use: No  . Sexual activity: Not on file   Other Topics Concern  . Not on file   Social History Narrative  . No narrative on file   The PMH, PSH, Social History, Family History, Medications, and allergies [facility], and have been updated if relevant.  OBJECTIVE: BP 122/62   Pulse 86   Temp 97.8 F (36.6 C) (Oral)   Wt 261 lb (118.4 kg)   LMP 09/15/2016   SpO2 94%   BMI 43.43 kg/m   She appears well, vital signs are as noted. Ears normal.  Throat and pharynx normal.  Neck supple. No adenopathy in the neck. Nose is congested. Sinuses tender. The chest is clear, without wheezes or rales.  ASSESSMENT:  sinusitis  PLAN: Given duration and progression of symptoms, will treat for bacterial sinusitis.  Symptomatic therapy suggested: push fluids, rest and return office visit prn if symptoms persist or worsen.  Call or return to clinic prn if these symptoms worsen or fail to improve as anticipated.

## 2016-10-07 NOTE — Progress Notes (Signed)
Pre visit review using our clinic review tool, if applicable. No additional management support is needed unless otherwise documented below in the visit note. 

## 2016-10-09 ENCOUNTER — Telehealth: Payer: Self-pay

## 2016-10-09 MED ORDER — BENZONATATE 200 MG PO CAPS: 200.0000 mg | ORAL_CAPSULE | Freq: Two times a day (BID) | ORAL | 0 refills | Status: DC | PRN

## 2016-10-09 NOTE — Telephone Encounter (Signed)
Pt left v/m; pt was seen 10/07/16; pt has not been able to sleep due to coughing. cheratussin has not helped the cough. Pt request different cough med.Please advise.

## 2016-10-09 NOTE — Telephone Encounter (Signed)
Cheratussin has codeine in it and is quite strong.  We could send in tessalon perles as well if she is interested.

## 2016-10-09 NOTE — Telephone Encounter (Signed)
Spoke to pt and advised. States she is wanting tessalon sent to pharmacy on file.

## 2016-10-09 NOTE — Addendum Note (Signed)
Addended by: Dianne DunARON, TALIA M on: 10/09/2016 12:08 PM   Modules accepted: Orders

## 2016-10-09 NOTE — Telephone Encounter (Signed)
eRx sent

## 2016-12-31 ENCOUNTER — Encounter: Payer: Self-pay | Admitting: Family Medicine

## 2016-12-31 ENCOUNTER — Ambulatory Visit (INDEPENDENT_AMBULATORY_CARE_PROVIDER_SITE_OTHER): Payer: 59 | Admitting: Family Medicine

## 2016-12-31 VITALS — BP 126/70 | HR 74 | Temp 98.5°F | Wt 268.5 lb

## 2016-12-31 DIAGNOSIS — R05 Cough: Secondary | ICD-10-CM

## 2016-12-31 DIAGNOSIS — R059 Cough, unspecified: Secondary | ICD-10-CM

## 2016-12-31 MED ORDER — BENZONATATE 200 MG PO CAPS: 200.0000 mg | ORAL_CAPSULE | Freq: Two times a day (BID) | ORAL | 0 refills | Status: DC | PRN

## 2016-12-31 MED ORDER — HYDROCODONE-HOMATROPINE 5-1.5 MG/5ML PO SYRP: 5.0000 mL | ORAL_SOLUTION | Freq: Three times a day (TID) | ORAL | 0 refills | Status: DC | PRN

## 2016-12-31 NOTE — Progress Notes (Signed)
Pre visit review using our clinic review tool, if applicable. No additional management support is needed unless otherwise documented below in the visit note. 

## 2016-12-31 NOTE — Progress Notes (Signed)
SUBJECTIVE:  Brenda Ewing is a 36 y.o. female who complains of dry cough for 10 days.  She initially had a URI a few weeks ago and most of her symptoms have resolved except for a dry, hacking cough.  OTC delsym not helping much.   She denies a history of anorexia, chest pain, chills, dizziness, fatigue, fevers and nausea and denies a history of asthma. Patient denies smoke cigarettes.   No current outpatient prescriptions on file prior to visit.   No current facility-administered medications on file prior to visit.     No Known Allergies  Past Medical History:  Diagnosis Date  . Menorrhagia     Past Surgical History:  Procedure Laterality Date  . TONSILLECTOMY      Family History  Problem Relation Age of Onset  . Diabetes Other     Social History   Social History  . Marital status: Single    Spouse name: N/A  . Number of children: N/A  . 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 Smoking status: Never Smoker  . Smokeless tobacco: Never Used  . Alcohol use Yes     Comment: occasional  . Drug use: No  . Sexual activity: Not on file   Other Topics Concern  . Not on file   Social History Narrative  . No narrative on file   The PMH, PSH, Social History, Family History, Medications, and allergies [facility], and have been updated if relevant.  OBJECTIVE: BP 126/70   Pulse 74   Temp 98.5 F (36.9 C) (Oral)   Wt 268 lb 8 oz (121.8 kg)   LMP 12/27/2016   SpO2 98%   BMI 44.68 kg/m   She appears well, vital signs are as noted. Ears normal.  Throat and pharynx normal.  Neck supple. No adenopathy in the neck. Nose is congested. Sinuses non tender. The chest is clear, without wheezes or rales.  ASSESSMENT:  Post URI persistent cough  PLAN: Tessalon eRx sent and given rx for hycodan to use prn severe cough at night- discussed sedation precautions. Symptomatic therapy suggested:  push fluids, rest and return office visit prn if symptoms persist or worsen. Lack of antibiotic effectiveness discussed with her. Call or return to clinic prn if these symptoms worsen or fail to improve as anticipated.

## 2017-06-22 ENCOUNTER — Ambulatory Visit (INDEPENDENT_AMBULATORY_CARE_PROVIDER_SITE_OTHER): Payer: 59 | Admitting: Family Medicine

## 2017-06-22 ENCOUNTER — Encounter: Payer: Self-pay | Admitting: Family Medicine

## 2017-06-22 VITALS — BP 120/80 | HR 79 | Ht 65.25 in | Wt 258.0 lb

## 2017-06-22 DIAGNOSIS — H6981 Other specified disorders of Eustachian tube, right ear: Secondary | ICD-10-CM | POA: Diagnosis not present

## 2017-06-22 DIAGNOSIS — H6991 Unspecified Eustachian tube disorder, right ear: Secondary | ICD-10-CM

## 2017-06-22 DIAGNOSIS — H6980 Other specified disorders of Eustachian tube, unspecified ear: Secondary | ICD-10-CM | POA: Insufficient documentation

## 2017-06-22 DIAGNOSIS — Z0001 Encounter for general adult medical examination with abnormal findings: Secondary | ICD-10-CM | POA: Diagnosis not present

## 2017-06-22 DIAGNOSIS — Z6841 Body Mass Index (BMI) 40.0 and over, adult: Secondary | ICD-10-CM | POA: Diagnosis not present

## 2017-06-22 DIAGNOSIS — Z01419 Encounter for gynecological examination (general) (routine) without abnormal findings: Secondary | ICD-10-CM

## 2017-06-22 DIAGNOSIS — Z114 Encounter for screening for human immunodeficiency virus [HIV]: Secondary | ICD-10-CM

## 2017-06-22 LAB — LIPID PANEL
CHOLESTEROL: 153 mg/dL (ref 0–200)
HDL: 47.6 mg/dL (ref >39.00)
LDL Cholesterol: 85 mg/dL (ref 0–99)
NonHDL: 105.72
TRIGLYCERIDES: 103 mg/dL (ref 0.0–149.0)
Total CHOL/HDL Ratio: 3
VLDL: 20.6 mg/dL (ref 0.0–40.0)

## 2017-06-22 LAB — COMPREHENSIVE METABOLIC PANEL
ALK PHOS: 41 U/L (ref 39–117)
ALT: 10 U/L (ref 0–35)
AST: 13 U/L (ref 0–37)
Albumin: 3.9 g/dL (ref 3.5–5.2)
BUN: 11 mg/dL (ref 6–23)
CO2: 29 mEq/L (ref 19–32)
Calcium: 9.1 mg/dL (ref 8.4–10.5)
Chloride: 102 mEq/L (ref 96–112)
Creatinine, Ser: 1.06 mg/dL (ref 0.40–1.20)
GFR: 76.5 mL/min (ref >60.00)
Glucose, Bld: 94 mg/dL (ref 70–99)
POTASSIUM: 4.5 meq/L (ref 3.5–5.1)
Sodium: 137 mEq/L (ref 135–145)
TOTAL PROTEIN: 6.9 g/dL (ref 6.0–8.3)
Total Bilirubin: 0.3 mg/dL (ref 0.2–1.2)

## 2017-06-22 LAB — CBC WITH DIFFERENTIAL/PLATELET
BASOS PCT: 0.6 % (ref 0.0–3.0)
Basophils Absolute: 0 10*3/uL (ref 0.0–0.1)
EOS PCT: 8.2 % — AB (ref 0.0–5.0)
Eosinophils Absolute: 0.4 10*3/uL (ref 0.0–0.7)
HCT: 38.1 % (ref 36.0–46.0)
HEMOGLOBIN: 12.6 g/dL (ref 12.0–15.0)
LYMPHS ABS: 1.8 10*3/uL (ref 0.7–4.0)
Lymphocytes Relative: 35.7 % (ref 12.0–46.0)
MCHC: 33 g/dL (ref 30.0–36.0)
MCV: 82.2 fl (ref 78.0–100.0)
Monocytes Absolute: 0.4 10*3/uL (ref 0.1–1.0)
Monocytes Relative: 7.3 % (ref 3.0–12.0)
Neutro Abs: 2.4 10*3/uL (ref 1.4–7.7)
Neutrophils Relative %: 48.2 % (ref 43.0–77.0)
Platelets: 258 10*3/uL (ref 150.0–400.0)
RBC: 4.64 Mil/uL (ref 3.87–5.11)
RDW: 15.7 % — AB (ref 11.5–15.5)
WBC: 4.9 10*3/uL (ref 4.0–10.5)

## 2017-06-22 LAB — TSH: TSH: 1.79 u[IU]/mL (ref 0.35–4.50)

## 2017-06-22 MED ORDER — PHENTERMINE HCL 30 MG PO CAPS: 30.0000 mg | ORAL_CAPSULE | ORAL | 2 refills | Status: DC

## 2017-06-22 NOTE — Assessment & Plan Note (Signed)
Deteriorated, has failed conservative tx. Refer to ENT for further evaluation and tx. The patient indicates understanding of these issues and agrees with the plan.

## 2017-06-22 NOTE — Progress Notes (Signed)
Subjective:   Patient ID: Brenda Ewing, female    DOB: 04/03/1981, 36 y.o.   MRN: [id number]  Brenda Ewing is a pleasant 36 y.o. year old female who presents to clinic today with Annual Exam  on 06/22/2017  HPI:  Not currently sexually. Has gyn- pap smear 08/2016. No h/o abnormal pap smears.  Obesity- just started working with a Systems analystpersonal trainer. Wants to restart phentermine now that she is motivated and making lifestyle changes. Did not have any side effects last time she took it.  ETD- has had popping in her right ear for months, feels full. Has tried claritin D and flonase without relief of symptoms.    No current outpatient prescriptions on file prior to visit.   No current facility-administered medications on file prior to visit.     No Known Allergies  Past Medical History:  Diagnosis Date  . Menorrhagia     Past Surgical History:  Procedure Laterality Date  . TONSILLECTOMY      Family History  Problem Relation Age of Onset  . Diabetes Other     Social History   Social History  . Marital status: Single    Spouse name: N/A  . Number of children: N/A  . 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 Smoking status: Never Smoker  . Smokeless tobacco: Never Used  . Alcohol use Yes     Comment: occasional  . Drug use: No  . Sexual activity: Not on file   Other Topics Concern  . Not on file   Social History Narrative  . No narrative on file   The PMH, PSH, Social History, Family History, Medications, and allergies [facility], and have been updated if relevant.   Review of Systems  Constitutional: Negative.   HENT: Negative.   Eyes: Negative.   Respiratory: Negative.   Cardiovascular: Negative.   Gastrointestinal: Negative.   Endocrine: Negative.   Musculoskeletal: Negative.   Skin: Negative.   Allergic/Immunologic: Negative.   Neurological: Negative.     Hematological: Negative.   Psychiatric/Behavioral: Negative.   All other systems reviewed and are negative.      Objective:    BP 120/80   Pulse 79   Ht 5' 5.25" (1.657 m)   Wt 258 lb (117 kg)   LMP 06/15/2017   SpO2 98%   BMI 42.61 kg/m   Wt Readings from Last 3 Encounters:  06/22/17 258 lb (117 kg)  12/31/16 268 lb 8 oz (121.8 kg)  10/07/16 261 lb (118.4 kg)     Physical Exam   General:  Well-developed,well-nourished,in no acute distress; alert,appropriate and cooperative throughout examination Head:  normocephalic and atraumatic.   Eyes:  vision grossly intact, PERRL Ears:  R ear normal and L ear normal externally, TMs clear bilaterally, does have some retraction of right TM Nose:  no external deformity.   Mouth:  good dentition.   Neck:  No deformities, masses, or tenderness noted. Lungs:  Normal respiratory effort, chest expands symmetrically. Lungs are clear to auscultation, no crackles or wheezes. Heart:  Normal rate and regular rhythm. S1 and S2 normal without gallop, murmur, click, rub or other extra sounds. Abdomen:  Bowel sounds positive,abdomen soft and non-tender without masses, organomegaly or hernias noted Extremities:  No clubbing, cyanosis, edema, or deformity noted with normal full range of motion of all joints.   Neurologic:  alert &  oriented X3 and gait normal.   Skin:  Intact without suspicious lesions or rashes Cervical Nodes:  No lymphadenopathy noted Axillary Nodes:  No palpable lymphadenopathy Psych:  Cognition and judgment appear intact. Alert and cooperative with normal attention span and concentration. No apparent delusions, illusions, hallucinations       Assessment & Plan:   Well woman exam - Plan: CBC with Differential/Platelet, Comprehensive metabolic panel, Lipid panel, TSH  Screening for HIV (human immunodeficiency virus) - Plan: HIV antibody (with reflex)  Dysfunction of right eustachian tube - Plan: Ambulatory referral to  ENT  Severe obesity (BMI >= 40) (HCC) No Follow-up on file.

## 2017-06-22 NOTE — Assessment & Plan Note (Signed)
Deteriorated. Discussed weight loss plan.  Pt would also like to discuss medication options- discussed phentermine risk benefits, side effects including HTN, pulmonary HTN, stroke.    She would like to start phentermine and lifestyle changes.  Follow up in 1 month.  If BMI < 27 will decrease to half dose x 1 month then stop

## 2017-06-22 NOTE — Assessment & Plan Note (Signed)
Reviewed preventive care protocols, scheduled due services, and updated immunizations Discussed nutrition, exercise, diet, and healthy lifestyle.  Orders Placed This Encounter  Procedures  . CBC with Differential/Platelet  . Comprehensive metabolic panel  . Lipid panel  . TSH  . HIV antibody (with reflex)    

## 2017-06-23 LAB — HIV ANTIBODY (ROUTINE TESTING W REFLEX): HIV: NONREACTIVE

## 2018-09-12 ENCOUNTER — Ambulatory Visit (INDEPENDENT_AMBULATORY_CARE_PROVIDER_SITE_OTHER): Payer: Managed Care, Other (non HMO) | Admitting: Family Medicine

## 2018-09-12 ENCOUNTER — Encounter: Payer: Self-pay | Admitting: Family Medicine

## 2018-09-12 VITALS — BP 112/68 | HR 93 | Temp 99.1°F | Ht 65.0 in | Wt 248.8 lb

## 2018-09-12 DIAGNOSIS — Z01419 Encounter for gynecological examination (general) (routine) without abnormal findings: Secondary | ICD-10-CM

## 2018-09-12 DIAGNOSIS — G4700 Insomnia, unspecified: Secondary | ICD-10-CM

## 2018-09-12 DIAGNOSIS — Z23 Encounter for immunization: Secondary | ICD-10-CM | POA: Diagnosis not present

## 2018-09-12 DIAGNOSIS — F329 Major depressive disorder, single episode, unspecified: Secondary | ICD-10-CM | POA: Insufficient documentation

## 2018-09-12 DIAGNOSIS — Z113 Encounter for screening for infections with a predominantly sexual mode of transmission: Secondary | ICD-10-CM | POA: Diagnosis not present

## 2018-09-12 DIAGNOSIS — Z30011 Encounter for initial prescription of contraceptive pills: Secondary | ICD-10-CM

## 2018-09-12 DIAGNOSIS — N898 Other specified noninflammatory disorders of vagina: Secondary | ICD-10-CM

## 2018-09-12 DIAGNOSIS — F32A Depression, unspecified: Secondary | ICD-10-CM | POA: Insufficient documentation

## 2018-09-12 DIAGNOSIS — Z309 Encounter for contraceptive management, unspecified: Secondary | ICD-10-CM | POA: Insufficient documentation

## 2018-09-12 DIAGNOSIS — F4322 Adjustment disorder with anxiety: Secondary | ICD-10-CM

## 2018-09-12 MED ORDER — NORETHINDRONE ACET-ETHINYL EST 1-20 MG-MCG PO TABS: 1.0000 | ORAL_TABLET | Freq: Every day | ORAL | 11 refills | Status: DC

## 2018-09-12 MED ORDER — SERTRALINE HCL 50 MG PO TABS: 50.0000 mg | ORAL_TABLET | Freq: Every day | ORAL | 3 refills | Status: DC

## 2018-09-12 NOTE — Assessment & Plan Note (Signed)
STD screening today. HIV, RPR lab work along with STD screening through pap.

## 2018-09-12 NOTE — Assessment & Plan Note (Signed)
Education given regarding options for contraception, including barrier methods, injectable contraception, IUD placement, oral contraceptives. She would like a trial of OCPs- eRx sent for Loestrin to her pharmacy.

## 2018-09-12 NOTE — Assessment & Plan Note (Signed)
Intermittent- wet prep and STD screening sent with pap smear. The patient indicates understanding of these issues and agrees with the plan.

## 2018-09-12 NOTE — Patient Instructions (Signed)
Great to see you. I will call you with your lab results from today and you can view them online.  We are starting loestrin birth control pills.  Please keep me updated.  Start Zoloft 50 mg. Please take 1/2 tablet daily for 10 days, then advance to 1 full tablet thereafter. Possible side effects of headache, GI upset, drowsiness and it can take up to 3 weeks to see a response.  Please keep me updated.

## 2018-09-12 NOTE — Assessment & Plan Note (Signed)
Reviewed preventive care protocols, scheduled due services, and updated immunizations Discussed nutrition, exercise, diet, and healthy lifestyle.  

## 2018-09-12 NOTE — Assessment & Plan Note (Signed)
Start Zoloft 50 mg. Patient is to take 1/2 tablet daily for 10 days, then advance to 1 full tablet thereafter. We discussed possible side effects of headache, GI upset, drowsiness, and SI/HI. If thoughts of SI/HI develop, we discussed to present to the emergency immediately. Patient verbalized understanding.    

## 2018-09-12 NOTE — Progress Notes (Signed)
Subjective:   Patient ID: Brenda Ewing, female    DOB: 1981-07-01, 37 y.o.   MRN: [id number]  Brenda Ewing is a pleasant 37 y.o. year old female who presents to clinic today with Annual Exam (Patient is here today for a CPE with PAP.  She is experiencing some vaginal discharge.  Would like to have STD screenings.  Agrees to Tdap but declines flu shot.  She is not currently fasting.)  on 09/12/2018  HPI:  Health Maintenance  Topic Date Due  . TETANUS/TDAP  08/02/2012  . PAP SMEAR  08/27/2019  . HIV Screening  Completed  . INFLUENZA VACCINE  Discontinued   She would like pap smear and STD screening today.  She is having some vaginal discharge.  No h/o abnormal pap smears.  She does want to restart OCPs- has been over 6 years since she was on them.  Cannot remember what she took previously. Not currently sexually active but she is becoming serious with her new boyfriend and will be sexually active soon.  Anxiety- work has been very stressful.  Looking for a new job but currently has not found one. Having panic attacks, headaches, insomnia. No SI or HI.  Has never been on SSRIs.  No current outpatient medications on file prior to visit.   No current facility-administered medications on file prior to visit.     No Known Allergies  Past Medical History:  Diagnosis Date  . Menorrhagia     Past Surgical History:  Procedure Laterality Date  . TONSILLECTOMY      Family History  Problem Relation Age of Onset  . Diabetes Other     Social History   Socioeconomic History  . Marital status: Single    Spouse name: Not on file  . Number of children: Not on file  . Years of education: Not on file  . Highest education level: Not on file  Occupational History  . Occupation: Building control surveyor: [employer]  Social Needs  . Financial resource strain: Not on file  . Food insecurity:    Worry: Not on file    Inability: Not on file  . Transportation needs:    Medical:  Not on file    Non-medical: Not on file  Tobacco Use  . Smoking status: Never Smoker  . Smokeless tobacco: Never Used  Substance and Sexual Activity  . Alcohol use: Yes    Comment: occasional  . Drug use: No  . Sexual activity: Not on file  Lifestyle  . Physical activity:    Days per week: Not on file    Minutes per session: Not on file  . Stress: Not on file  Relationships  . Social connections:    Talks on phone: Not on file    Gets together: Not on file    Attends religious service: Not on file    Active member of club or organization: Not on file    Attends meetings of clubs or organizations: Not on file    Relationship status: Not on file  . Intimate partner violence:    Fear of current or ex partner: Not on file    Emotionally abused: Not on file    Physically abused: Not on file    Forced sexual activity: Not on file  Other Topics Concern  . Not on file  Social History Narrative  . Not on file   The PMH, PSH, Social History, Family History, Medications, and allergies  have been reviewed in [facility], and have been updated if relevant..  Review of Systems  Constitutional: Negative.   HENT: Negative.   Eyes: Negative.   Respiratory: Negative.   Cardiovascular: Negative.   Gastrointestinal: Negative.   Endocrine: Negative.   Genitourinary: Positive for vaginal discharge. Negative for decreased urine volume, pelvic pain, urgency, vaginal bleeding and vaginal pain.  Musculoskeletal: Negative.   Skin: Negative.   Allergic/Immunologic: Negative.   Neurological: Positive for headaches. Negative for dizziness, tremors, seizures, syncope, facial asymmetry, speech difficulty, weakness, light-headedness and numbness.  Hematological: Negative.   Psychiatric/Behavioral: Positive for decreased concentration, dysphoric mood and sleep disturbance. Negative for agitation, behavioral problems, confusion and self-injury. The patient is nervous/anxious. The patient is not hyperactive.     All other systems reviewed and are negative.      Objective:    BP 112/68 (BP Location: Left Arm, Patient Position: Sitting, Cuff Size: Normal)   Pulse 93   Temp 99.1 F (37.3 C) (Oral)   Ht 5\' 5"  (1.651 m)   Wt 248 lb 12.8 oz (112.9 kg)   LMP 08/18/2018   SpO2 95%   BMI 41.40 kg/m    Physical Exam   General:  Well-developed,well-nourished,in no acute distress; alert,appropriate and cooperative throughout examination Head:  normocephalic and atraumatic.   Eyes:  vision grossly intact, PERRL Ears:  R ear normal and L ear normal externally, TMs clear bilaterally Nose:  no external deformity.   Mouth:  good dentition.   Neck:  No deformities, masses, or tenderness noted. Breasts:  No mass, nodules, thickening, tenderness, bulging, retraction, inflamation, nipple discharge or skin changes noted.   Lungs:  Normal respiratory effort, chest expands symmetrically. Lungs are clear to auscultation, no crackles or wheezes. Heart:  Normal rate and regular rhythm. S1 and S2 normal without gallop, murmur, click, rub or other extra sounds. Abdomen:  Bowel sounds positive,abdomen soft and non-tender without masses, organomegaly or hernias noted. Rectal:  no external abnormalities.   Genitalia:  Pelvic Exam:        External: normal female genitalia without lesions or masses        Vagina: normal without lesions or masses        Cervix: normal without lesions or masses        Adnexa: normal bimanual exam without masses or fullness        Uterus: normal by palpation        Pap smear: performed Msk:  No deformity or scoliosis noted of thoracic or lumbar spine.   Extremities:  No clubbing, cyanosis, edema, or deformity noted with normal full range of motion of all joints.   Neurologic:  alert & oriented X3 and gait normal.   Skin:  Intact without suspicious lesions or rashes Cervical Nodes:  No lymphadenopathy noted Axillary Nodes:  No palpable lymphadenopathy Psych:  Cognition and  judgment appear intact. Alert and cooperative with normal attention span and concentration. No apparent delusions, illusions, hallucinations       Assessment & Plan:   Well woman exam with routine gynecological exam - Plan: CBC with Differential/Platelet, Comprehensive metabolic panel, Lipid panel, TSH, CANCELED: Cytology - PAP  Vaginal discharge - Plan: CANCELED: Cytology - PAP  Need for Tdap vaccination - Plan: Tdap vaccine greater than or equal to 7yo IM  Screening examination for STD (sexually transmitted disease) - Plan: HIV antibody (with reflex), RPR, CANCELED: HIV antibody (with reflex), CANCELED: RPR  Adjustment disorder with anxiety  Insomnia, unspecified type  Encounter for initial  prescription of contraceptive pills No follow-ups on file.

## 2018-09-13 LAB — COMPREHENSIVE METABOLIC PANEL
ALK PHOS: 39 IU/L (ref 39–117)
ALT: 9 IU/L (ref 0–32)
AST: 11 IU/L (ref 0–40)
Albumin/Globulin Ratio: 1.8 (ref 1.2–2.2)
Albumin: 4.2 g/dL (ref 3.5–5.5)
BILIRUBIN TOTAL: 0.2 mg/dL (ref 0.0–1.2)
BUN/Creatinine Ratio: 13 (ref 9–23)
BUN: 12 mg/dL (ref 6–20)
CHLORIDE: 105 mmol/L (ref 96–106)
CO2: 22 mmol/L (ref 20–29)
CREATININE: 0.91 mg/dL (ref 0.57–1.00)
Calcium: 8.9 mg/dL (ref 8.7–10.2)
GFR calc Af Amer: 95 mL/min/{1.73_m2} (ref >59)
GFR calc non Af Amer: 83 mL/min/{1.73_m2} (ref >59)
GLUCOSE: 89 mg/dL (ref 65–99)
Globulin, Total: 2.3 g/dL (ref 1.5–4.5)
Potassium: 3.8 mmol/L (ref 3.5–5.2)
SODIUM: 138 mmol/L (ref 134–144)
Total Protein: 6.5 g/dL (ref 6.0–8.5)

## 2018-09-13 LAB — CBC WITH DIFFERENTIAL/PLATELET
BASOS ABS: 0 10*3/uL (ref 0.0–0.2)
Basos: 0 %
EOS (ABSOLUTE): 0.3 10*3/uL (ref 0.0–0.4)
Eos: 5 %
Hematocrit: 35.6 % (ref 34.0–46.6)
Hemoglobin: 11.5 g/dL (ref 11.1–15.9)
Immature Grans (Abs): 0 10*3/uL (ref 0.0–0.1)
Immature Granulocytes: 0 %
LYMPHS ABS: 2.1 10*3/uL (ref 0.7–3.1)
Lymphs: 34 %
MCH: 27.4 pg (ref 26.6–33.0)
MCHC: 32.3 g/dL (ref 31.5–35.7)
MCV: 85 fL (ref 79–97)
MONOS ABS: 0.5 10*3/uL (ref 0.1–0.9)
Monocytes: 8 %
Neutrophils Absolute: 3.3 10*3/uL (ref 1.4–7.0)
Neutrophils: 53 %
Platelets: 228 10*3/uL (ref 150–450)
RBC: 4.2 x10E6/uL (ref 3.77–5.28)
RDW: 14.5 % (ref 12.3–15.4)
WBC: 6.2 10*3/uL (ref 3.4–10.8)

## 2018-09-13 LAB — TSH: TSH: 2.05 u[IU]/mL (ref 0.450–4.500)

## 2018-09-13 LAB — RPR: RPR Ser Ql: NONREACTIVE

## 2018-09-13 LAB — LIPID PANEL
CHOLESTEROL TOTAL: 156 mg/dL (ref 100–199)
Chol/HDL Ratio: 2.8 ratio (ref 0.0–4.4)
HDL: 56 mg/dL (ref >39)
LDL CALC: 81 mg/dL (ref 0–99)
TRIGLYCERIDES: 95 mg/dL (ref 0–149)
VLDL Cholesterol Cal: 19 mg/dL (ref 5–40)

## 2018-09-13 LAB — HIV ANTIBODY (ROUTINE TESTING W REFLEX): HIV Screen 4th Generation wRfx: NONREACTIVE

## 2018-09-16 LAB — IGP,CTNGTV,APT HPV,RFX16/18,45
CHLAMYDIA, NUC. ACID AMP: NEGATIVE
GONOCOCCUS, NUC. ACID AMP: NEGATIVE
HPV APTIMA: NEGATIVE
PAP SMEAR COMMENT: 0
TRICH VAG BY NAA: NEGATIVE

## 2019-03-02 ENCOUNTER — Other Ambulatory Visit: Payer: Self-pay | Admitting: Family Medicine

## 2019-05-09 ENCOUNTER — Ambulatory Visit (INDEPENDENT_AMBULATORY_CARE_PROVIDER_SITE_OTHER): Payer: Managed Care, Other (non HMO) | Admitting: Family Medicine

## 2019-05-09 VITALS — Temp 98.1°F

## 2019-05-09 DIAGNOSIS — F419 Anxiety disorder, unspecified: Secondary | ICD-10-CM

## 2019-05-09 DIAGNOSIS — F329 Major depressive disorder, single episode, unspecified: Secondary | ICD-10-CM | POA: Diagnosis not present

## 2019-05-09 DIAGNOSIS — G4700 Insomnia, unspecified: Secondary | ICD-10-CM

## 2019-05-09 MED ORDER — TRAZODONE HCL 50 MG PO TABS: 25.0000 mg | ORAL_TABLET | Freq: Every evening | ORAL | 3 refills | Status: DC | PRN

## 2019-05-09 NOTE — Assessment & Plan Note (Signed)
>  25 minutes spent in face to face time with patient, >50% spent in counselling or coordination of care.  GAD7 and PHQ9 scores extremely high.  She denies any SI or HI and agrees to go to the ED if she has SI/plan. She feels she wouldn't have any of these symptoms if she had a different job. After talking about treatment options, we agreed to an urgent psychiatric referral and to start trazodone at bedtime to hopefully help with insomnia until she can be seen by psychiatry.

## 2019-05-09 NOTE — Progress Notes (Signed)
Virtual Visit via Video   Due to the COVID-19 pandemic, this visit was completed with telemedicine (audio/video) technology to reduce patient and provider exposure as well as to preserve personal protective equipment.   I connected with Jaquala C [place] by a video enabled telemedicine application and verified that I am speaking with the correct person using two identifiers. Location patient: Home Location provider: [facility], Office Persons participating in the virtual visit: Evany C [place], Ruthe Mannanalia Aron, MD   I discussed the limitations of evaluation and management by telemedicine and the availability of in person appointments. The patient expressed understanding and agreed to proceed.  Care Team   Patient Care Team: Dianne DunAron, Talia M, MD as PCP - General  Subjective:   HPI:   Follow up anxiety/depression-  She took Zoloft 50mg  from October till March but it didn't help at all. Still up at night, not able to sleep, feels like may need to go a different route. She does not go anywhere or do anything anymore. Thoughts of self-harm but talks to mother when feels this way has not self harmed.  She is under tremendous stress at work- boss is harassing.  She feels this is situational- she is looking for another job but this a difficult time to find a new job.  She has a complaint filed against her boss.  She denies any true SI or HI- never has had a plan.  She is not sleeping at all.  Symptoms are better on weekends.  Depression screen [facility] 2/9 05/09/2019 09/12/2018  Decreased Interest 3 0  Down, Depressed, Hopeless 3 0  PHQ - 2 Score 6 0  Altered sleeping 3 -  Tired, decreased energy 3 -  Change in appetite 2 -  Feeling bad or failure about yourself  3 -  Trouble concentrating 3 -  Moving slowly or fidgety/restless 1 -  Suicidal thoughts 2 -  PHQ-9 Score 23 -  Difficult doing work/chores Extremely dIfficult -   GAD 7 : Generalized Anxiety Score 05/09/2019  Nervous, Anxious, on  Edge 3  Control/stop worrying 3  Worry too much - different things 3  Trouble relaxing 3  Restless 3  Easily annoyed or irritable 3  Afraid - awful might happen 3  Total GAD 7 Score 21  Anxiety Difficulty Extremely difficult     Review of Systems  Psychiatric/Behavioral: Positive for depression. Negative for hallucinations, memory loss, substance abuse and suicidal ideas. The patient is nervous/anxious and has insomnia.   All other systems reviewed and are negative.    Patient Active Problem List   Diagnosis Date Noted  . Well woman exam with routine gynecological exam 09/12/2018  . Vaginal discharge 09/12/2018  . Screening examination for STD (sexually transmitted disease) 09/12/2018  . Adjustment disorder with anxiety 09/12/2018  . Contraception management 09/12/2018  . Well woman exam 06/22/2017  . ETD (eustachian tube dysfunction) 06/22/2017  . Severe obesity (BMI >= 40) (HCC) 10/02/2014  . Insomnia 03/02/2011  . MENORRHAGIA 09/29/2010    Social History   Tobacco Use  . Smoking status: Never Smoker  . Smokeless tobacco: Never Used  Substance Use Topics  . Alcohol use: Yes    Comment: occasional    Current Outpatient Medications:  .  norethindrone-ethinyl estradiol (LOESTRIN 1/20, 21,) 1-20 MG-MCG tablet, Take 1 tablet by mouth daily., Disp: 1 Package, Rfl: 11 .  sertraline (ZOLOFT) 50 MG tablet, TAKE 1 TABLET BY MOUTH EVERY DAY, Disp: 30 tablet, Rfl: 3  No Known  Allergies  Objective:  Temp 98.1 F (36.7 C) (Oral)   VITALS: Per patient if applicable, see vitals. GENERAL: Alert, appears well and in no acute distress. HEENT: Atraumatic, conjunctiva clear, no obvious abnormalities on inspection of external nose and ears. NECK: Normal movements of the head and neck. CARDIOPULMONARY: No increased WOB. Speaking in clear sentences. I:E ratio WNL.  MS: Moves all visible extremities without noticeable abnormality. PSYCH: Pleasant and cooperative, well-groomed. Speech  normal rate and rhythm. Affect is appropriate. Insight and judgement are appropriate. Attention is focused, linear, and appropriate.  NEURO: CN grossly intact. Oriented as arrived to appointment on time with no prompting. Moves both UE equally.  SKIN: No obvious lesions, wounds, erythema, or cyanosis noted on face or hands.  Depression screen PHQ 2/9 09/12/2018  Decreased Interest 0  Down, Depressed, Hopeless 0  PHQ - 2 Score 0    Assessment and Plan:   There are no diagnoses linked to this encounter.  Marland Kitchen COVID-19 Education: The signs and symptoms of COVID-19 were discussed with the patient and how to seek care for testing if needed. The importance of social distancing was discussed today. . Reviewed expectations re: course of current medical issues. . Discussed self-management of symptoms. . Outlined signs and symptoms indicating need for more acute intervention. . Patient verbalized understanding and all questions were answered. Marland Kitchen Health Maintenance issues including appropriate healthy diet, exercise, and smoking avoidance were discussed with patient. . See orders for this visit as documented in the electronic medical record.  Ruthe Mannan, MD  Records requested if needed. Time spent: 25 minutes, of which >50% was spent in obtaining information about her symptoms, reviewing her previous labs, evaluations, and treatments, counseling her about her condition (please see the discussed topics above), and developing a plan to further investigate it; she had a number of questions which I addressed.

## 2019-05-22 ENCOUNTER — Telehealth: Payer: Self-pay | Admitting: Family Medicine

## 2019-05-22 NOTE — Telephone Encounter (Signed)
Pt called in stating she was told to f/u with Dr. Deborra Medina if she had trouble getting in with Dr. Domingo Pulse. There is not availability until late August. Pt notes that she has downloaded a sleep app in the meantime and is sleeping approximately 4 hr per night which is better but she said she not getting rest. Pt said she took 1/2 trazadone at night for 10 days and is now taking 1 full pill at night. She is asking for advice/medication mgmt. Please call

## 2019-05-23 MED ORDER — TRAZODONE HCL 100 MG PO TABS: 100.0000 mg | ORAL_TABLET | Freq: Every day | ORAL | 3 refills | Status: DC

## 2019-05-23 NOTE — Telephone Encounter (Signed)
Pt has been taking 50mg  for 3 weeks/advised to go ahead with increase to 100mg /new Rx sent in/thx dmf

## 2019-05-23 NOTE — Telephone Encounter (Signed)
Has the trazodone helped at all?  How long has she been taking 50 mg ?  We may need to increase her dose to 100 mg nightly.

## 2019-05-23 NOTE — Telephone Encounter (Signed)
TA-Plz see message below/states having problems getting in with Dr. Adah Perl until late August/she downloaded a sleep app and is getting approx 4 hours qhs but not getting much rest/took 1/2 traz qhs x10d and has increased to 1qhs/is asking for advise as she is still not resting/plz advise/thx dmf

## 2019-08-12 ENCOUNTER — Other Ambulatory Visit: Payer: Self-pay | Admitting: Family Medicine

## 2021-02-07 ENCOUNTER — Encounter: Payer: Self-pay | Admitting: Nurse Practitioner

## 2021-02-07 ENCOUNTER — Ambulatory Visit: Payer: 59 | Admitting: Nurse Practitioner

## 2021-02-07 ENCOUNTER — Other Ambulatory Visit: Payer: Self-pay

## 2021-02-07 VITALS — BP 132/94 | HR 79 | Temp 99.0°F | Ht 67.8 in | Wt 256.6 lb

## 2021-02-07 DIAGNOSIS — F320 Major depressive disorder, single episode, mild: Secondary | ICD-10-CM

## 2021-02-07 DIAGNOSIS — Z6839 Body mass index (BMI) 39.0-39.9, adult: Secondary | ICD-10-CM | POA: Diagnosis not present

## 2021-02-07 DIAGNOSIS — E6609 Other obesity due to excess calories: Secondary | ICD-10-CM | POA: Diagnosis not present

## 2021-02-07 DIAGNOSIS — Z7689 Persons encountering health services in other specified circumstances: Secondary | ICD-10-CM | POA: Diagnosis not present

## 2021-02-07 MED ORDER — ESCITALOPRAM OXALATE 10 MG PO TABS: 10.0000 mg | ORAL_TABLET | Freq: Every day | ORAL | 2 refills | Status: DC

## 2021-02-07 NOTE — Patient Instructions (Signed)
Managing Loss, Adult People experience loss in many different ways throughout their lives. Events such as moving, changing jobs, and losing friends can create a sense of loss. The loss may be as serious as a major health change, divorce, death of a pet, or death of a loved one. All of these types of loss are likely to create a physical and emotional reaction known as grief. Grief is the result of a major change or an absence of something or someone that you count on. Grief is a normal reaction to loss. A variety of factors can affect your grieving experience, including:  The nature of your loss.  Your relationship to what or whom you lost.  Your understanding of grief and how to manage it.  Your support system. How to manage lifestyle changes Keep to your normal routine as much as possible.  If you have trouble focusing or doing normal activities, it is acceptable to take some time away from your normal routine.  Spend time with friends and loved ones.  Eat a healthy diet, get plenty of sleep, and rest when you feel tired.   How to recognize changes  The way that you deal with your grief will affect your ability to function as you normally do. When grieving, you may experience these changes:  Numbness, shock, sadness, anxiety, anger, denial, and guilt.  Thoughts about death.  Unexpected crying.  A physical sensation of emptiness in your stomach.  Problems sleeping and eating.  Tiredness (fatigue).  Loss of interest in normal activities.  Dreaming about or imagining seeing the person who died.  A need to remember what or whom you lost.  Difficulty thinking about anything other than your loss for a period of time.  Relief. If you have been expecting the loss for a while, you may feel a sense of relief when it happens. Follow these instructions at home: Activity Express your feelings in healthy ways, such as:  Talking with others about your loss. It may be helpful to find  others who have had a similar loss, such as a support group.  Writing down your feelings in a journal.  Doing physical activities to release stress and emotional energy.  Doing creative activities like painting, sculpting, or playing or listening to music.  Practicing resilience. This is the ability to recover and adjust after facing challenges. Reading some resources that encourage resilience may help you to learn ways to practice those behaviors.   General instructions  Be patient with yourself and others. Allow the grieving process to happen, and remember that grieving takes time. ? It is likely that you may never feel completely done with some grief. You may find a way to move on while still cherishing memories and feelings about your loss. ? Accepting your loss is a process. It can take months or longer to adjust.  Keep all follow-up visits as told by your health care provider. This is important. Where to find support To get support for managing loss:  Ask your health care provider for help and recommendations, such as grief counseling or therapy.  Think about joining a support group for people who are managing a loss. Where to find more information You can find more information about managing loss from:  American Society of Clinical Oncology: www.cancer.net  American Psychological Association: www.apa.org Contact a health care provider if:  Your grief is extreme and keeps getting worse.  You have ongoing grief that does not improve.  Your body shows symptoms   of grief, such as illness.  You feel depressed, anxious, or lonely. Get help right away if:  You have thoughts about hurting yourself or others. If you ever feel like you may hurt yourself or others, or have thoughts about taking your own life, get help right away. You can go to your nearest emergency department or call:  Your local emergency services (911 in the [place]  A suicide crisis helpline, such as the  National Suicide Prevention Lifeline at 1-800-273-8255. This is open 24 hours a day. Summary  Grief is the result of a major change or an absence of someone or something that you count on. Grief is a normal reaction to loss.  The depth of grief and the period of recovery depend on the type of loss and your ability to adjust to the change and process your feelings.  Processing grief requires patience and a willingness to accept your feelings and talk about your loss with people who are supportive.  It is important to find resources that work for you and to realize that people experience grief differently. There is not one grieving process that works for everyone in the same way.  Be aware that when grief becomes extreme, it can lead to more severe issues like isolation, depression, anxiety, or suicidal thoughts. Talk with your health care provider if you have any of these issues. This information is not intended to replace advice given to you by your health care provider. Make sure you discuss any questions you have with your health care provider. Document Revised: 05/16/2020 Document Reviewed: 05/16/2020 Elsevier Patient Education  2021 Elsevier Inc.  

## 2021-02-07 NOTE — Progress Notes (Signed)
I,Tianna Badgett,acting as a Neurosurgeon for [employer], NP.,have documented all relevant documentation on the behalf of [employer], NP,as directed by  Charlesetta Ivory, NP while in the presence of Charlesetta Ivory, NP.  This visit occurred during the SARS-CoV-2 public health emergency.  Safety protocols were in place, including screening questions prior to the visit, additional usage of staff PPE, and extensive cleaning of exam room while observing appropriate contact time as indicated for disinfecting solutions.  Subjective:     Patient ID: Brenda Ewing , female    DOB: 02-02-1981 , 40 y.o.   MRN: [id number]   Chief Complaint  Patient presents with  . Establish Care    HPI  Patient is here to establish care. She was seeing Dr. Clifton Custard in 2020. She would like to discuss options with dealing with stress and grief. 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 She believes this stress and grief is causing her to have sleep issues. She is not suicidal or feel like harming anyone else.  She was previously seen at [facility] but the provider she was seeing left the practice.  Her dad had diabetes, heart disease etc. So she really wants to start taking care of herself. 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 She had a paps smear last year. No kids. She has a boyfriend. She does not smoke. She drinks occasionally. Agreed to come back in a few weeks for a complete physical exam and blood work.     Past Medical History:  Diagnosis Date  . Menorrhagia      Family History  Problem Relation Age of Onset  . Diabetes Other   . Healthy Mother   . Parkinson's disease Mother   . Heart disease Father   . Diabetes Father   . Stroke Father   . Healthy Maternal Grandmother   . Cancer Maternal Grandfather   . Parkinson's disease Paternal Grandmother   . Cancer Paternal Grandfather      Current Outpatient Medications:  .  escitalopram (LEXAPRO) 10 MG tablet, Take 1 tablet (10 mg  total) by mouth daily., Disp: 30 tablet, Rfl: 2   No Known Allergies   Review of Systems  Constitutional: Negative.  Negative for chills and fever.  Respiratory: Negative.  Negative for cough, shortness of breath and wheezing.   Cardiovascular: Negative.  Negative for chest pain and palpitations.  Gastrointestinal: Negative.   Neurological: Negative.   Psychiatric/Behavioral: Positive for sleep disturbance. Negative for suicidal ideas.     Today's Vitals   02/07/21 0829  BP: (!) 132/94  Pulse: 79  Temp: 99 F (37.2 C)  TempSrc: Oral  Weight: 256 lb 9.6 oz (116.4 kg)  Height: 5' 7.8" (1.722 m)   Body mass index is 39.25 kg/m.   Objective:  Physical Exam Constitutional:      Appearance: Normal appearance. She is obese. She is not ill-appearing.  HENT:     Head: Normocephalic and atraumatic.  Cardiovascular:     Rate and Rhythm: Normal rate and regular rhythm.     Pulses: Normal pulses.     Heart sounds: Normal heart sounds.  Pulmonary:     Effort: Pulmonary effort is normal. No respiratory distress.     Breath sounds: Normal breath sounds. No wheezing.  Skin:    Capillary Refill: Capillary refill takes less than 2 seconds.  Neurological:     Mental Status: She is alert and oriented to person, place, and time.  Psychiatric:  Mood and Affect: Mood is depressed. Affect is tearful.         Assessment And Plan:     1. Encounter to establish care -Patient is here to establish care. Went over her social history, family history, medical, and medications. Went over her allergies.  -Went over with patient importance of annual PCP visits for screenings, immunizations, etc.   2. Current mild episode of major depressive disorder, unspecified whether recurrent (HCC) -Patient is tearful due to having a lot of stress and her father recently passing away.  - escitalopram (LEXAPRO) 10 MG tablet; Take 1 tablet (10 mg total) by mouth daily.  Dispense: 30 tablet; Refill: 2 -  Ambulatory referral to Psychology sent  -Educated patient to come back in 4-6 for medication follow up and physical exam with blood work.  -Educated patient about the SE of lexapro and that it will take few weeks to work.  -Educated patient to use relaxation methods such as reading, exercising, walking as stress relievers.   3. Class 2 obesity due to excess calories without serious comorbidity with body mass index (BMI) of 39.0 to 39.9 in adult  -Educated patient about the importance of eating a healthy diet, consisting of low fat foods and drinks. Avoid sugary drinks. Drink a lot of water. Increase intake of fish and grilled meats. Exercise for atleast 4-5 times a week.   Follow up: 4-6 weeks for med. Follow up and annual physical exam    Patient was given opportunity to ask questions. Patient verbalized understanding of the plan and was able to repeat key elements of the plan. All questions were answered to their satisfaction.  Charlesetta Ivory, NP   I, Charlesetta Ivory, NP, have reviewed all documentation for this visit. The documentation on 02/07/21 for the exam, diagnosis, procedures, and orders are all accurate and complete.  THE PATIENT IS ENCOURAGED TO PRACTICE SOCIAL DISTANCING DUE TO THE COVID-19 PANDEMIC.

## 2021-03-01 ENCOUNTER — Other Ambulatory Visit: Payer: Self-pay | Admitting: Nurse Practitioner

## 2021-03-01 DIAGNOSIS — F320 Major depressive disorder, single episode, mild: Secondary | ICD-10-CM

## 2021-03-07 ENCOUNTER — Encounter: Payer: 59 | Admitting: Nurse Practitioner
